# Patient Record
Sex: Male | Born: 1945 | Race: Black or African American | Hispanic: No | Marital: Married | Smoking: Former smoker
Health system: Southern US, Community
[De-identification: ages and names within clinical notes are randomized; demographics above are authoritative.]

## PROBLEM LIST (undated history)

## (undated) DIAGNOSIS — Z973 Presence of spectacles and contact lenses: Secondary | ICD-10-CM

## (undated) DIAGNOSIS — N529 Male erectile dysfunction, unspecified: Secondary | ICD-10-CM

## (undated) HISTORY — PX: HEMORRHOID SURGERY: SHX153

---

## 1988-01-09 HISTORY — PX: INGUINAL HERNIA REPAIR: SHX194

## 2000-06-11 ENCOUNTER — Encounter: Admission: RE | Admit: 2000-06-11 | Discharge: 2000-06-11 | Payer: Self-pay | Admitting: Internal Medicine

## 2000-06-11 ENCOUNTER — Encounter: Payer: Self-pay | Admitting: Internal Medicine

## 2007-07-16 ENCOUNTER — Ambulatory Visit (HOSPITAL_COMMUNITY): Admission: RE | Admit: 2007-07-16 | Discharge: 2007-07-16 | Payer: Self-pay | Admitting: Gastroenterology

## 2010-05-23 NOTE — Op Note (Signed)
NAME:  John Walters, John Walters NO.:  [id number]   MEDICAL RECORD NO.:  [id number]          PATIENT TYPE:  AMB   LOCATION:  ENDO                         FACILITY:  [facility]   PHYSICIAN:  Danise Edge, M.D.   DATE OF BIRTH:  1945-09-13   DATE OF PROCEDURE:  07/16/2007  DATE OF DISCHARGE:                               OPERATIVE REPORT   REFERRING PHYSICIAN:  Candyce Churn, MD.   PROCEDURE INDICATION:  [name] is a 65 year old male, born  12-01-1945.  [name] is scheduled to undergo his first screening  colonoscopy with polypectomy to prevent colon cancer.  He has been  treated for diverticulitis in the past.   ENDOSCOPIST:  Danise Edge, MD.   PREMEDICATION:  Fentanyl 50 mcg and Versed 7 mg.   PROCEDURE:  After obtaining informed consent, [name] was placed in  the left lateral decubitus position.  I administered intravenous  fentanyl and intravenous Versed to achieve conscious sedation for the  procedure.  The patient's blood pressure, oxygen saturation, and cardiac  rhythm were monitored throughout the procedure and documented in the  medical record.   Anal inspection and digital rectal exam were normal.  The prostate was  enlarged, but non-nodular.  The Pentax pediatric colonoscope was  introduced into the rectum and advanced to the cecum as identified by a  normal-appearing ileocecal valve and appendiceal orifice.  Colonic  preparation for the exam today was good.   Rectum:  Normal.  There was not enough room to perform a retroflexed  view of the distal rectum.  Sigmoid colon and descending colon:  Left colonic diverticulosis without  diverticulitis.  Splenic flexure:  Normal.  Transverse colon:  Normal.  Hepatic flexure:  Normal.  Ascending colon:  Normal.  Cecum and ileocecal valve:  Normal.   ASSESSMENT:  Normal screening proctocolonoscopy to the cecum.  No  endoscopic evidence for the presence of colorectal neoplasia.  Left  colonic diverticulosis noted without signs of diverticulitis or  diverticular stricture formation.   RECOMMENDATIONS:  Repeat colonoscopy in 10 years.           ______________________________  Danise Edge, M.D.     MJ/MEDQ  D:  07/16/2007  T:  07/16/2007  Job:  [id number]   cc:   Candyce Churn, M.D.

## 2016-01-13 DIAGNOSIS — Z1389 Encounter for screening for other disorder: Secondary | ICD-10-CM | POA: Diagnosis not present

## 2016-01-13 DIAGNOSIS — R030 Elevated blood-pressure reading, without diagnosis of hypertension: Secondary | ICD-10-CM | POA: Diagnosis not present

## 2016-01-13 DIAGNOSIS — K5730 Diverticulosis of large intestine without perforation or abscess without bleeding: Secondary | ICD-10-CM | POA: Diagnosis not present

## 2016-01-13 DIAGNOSIS — K219 Gastro-esophageal reflux disease without esophagitis: Secondary | ICD-10-CM | POA: Diagnosis not present

## 2016-01-13 DIAGNOSIS — Z79899 Other long term (current) drug therapy: Secondary | ICD-10-CM | POA: Diagnosis not present

## 2016-01-13 DIAGNOSIS — J309 Allergic rhinitis, unspecified: Secondary | ICD-10-CM | POA: Diagnosis not present

## 2016-01-13 DIAGNOSIS — E559 Vitamin D deficiency, unspecified: Secondary | ICD-10-CM | POA: Diagnosis not present

## 2016-01-13 DIAGNOSIS — B353 Tinea pedis: Secondary | ICD-10-CM | POA: Diagnosis not present

## 2016-01-13 DIAGNOSIS — Z0001 Encounter for general adult medical examination with abnormal findings: Secondary | ICD-10-CM | POA: Diagnosis not present

## 2016-08-29 DIAGNOSIS — K648 Other hemorrhoids: Secondary | ICD-10-CM | POA: Diagnosis not present

## 2016-08-29 DIAGNOSIS — M545 Low back pain: Secondary | ICD-10-CM | POA: Diagnosis not present

## 2016-08-29 DIAGNOSIS — K219 Gastro-esophageal reflux disease without esophagitis: Secondary | ICD-10-CM | POA: Diagnosis not present

## 2016-08-29 DIAGNOSIS — G8929 Other chronic pain: Secondary | ICD-10-CM | POA: Diagnosis not present

## 2016-08-29 MED FILL — traMADol HCL 50 MG TABS: 50 | 10 days supply | Qty: 40 | Fill #0

## 2016-08-29 MED FILL — CYCLOBENZAPRINE 5 MG TABLET: 5 | 10 days supply | Qty: 30 | Fill #0

## 2016-08-29 MED FILL — ANUCORT-HC 25 MG SUPP: 25 | 3 days supply | Qty: 6 | Fill #0

## 2016-08-29 MED FILL — OMEPRAZOLE 20 MG CAP: 20 | 30 days supply | Qty: 30 | Fill #0

## 2016-08-29 MED FILL — IBUPROFEN 800 MG TAB: 800 | 10 days supply | Qty: 30 | Fill #0

## 2016-10-10 DIAGNOSIS — Z23 Encounter for immunization: Secondary | ICD-10-CM | POA: Diagnosis not present

## 2017-01-22 DIAGNOSIS — Z79899 Other long term (current) drug therapy: Secondary | ICD-10-CM | POA: Diagnosis not present

## 2017-01-22 DIAGNOSIS — B353 Tinea pedis: Secondary | ICD-10-CM | POA: Diagnosis not present

## 2017-01-22 DIAGNOSIS — E669 Obesity, unspecified: Secondary | ICD-10-CM | POA: Diagnosis not present

## 2017-01-22 DIAGNOSIS — K5730 Diverticulosis of large intestine without perforation or abscess without bleeding: Secondary | ICD-10-CM | POA: Diagnosis not present

## 2017-01-22 DIAGNOSIS — L309 Dermatitis, unspecified: Secondary | ICD-10-CM | POA: Diagnosis not present

## 2017-01-22 DIAGNOSIS — R05 Cough: Secondary | ICD-10-CM | POA: Diagnosis not present

## 2017-01-22 DIAGNOSIS — K219 Gastro-esophageal reflux disease without esophagitis: Secondary | ICD-10-CM | POA: Diagnosis not present

## 2017-01-22 DIAGNOSIS — J309 Allergic rhinitis, unspecified: Secondary | ICD-10-CM | POA: Diagnosis not present

## 2017-01-22 DIAGNOSIS — E559 Vitamin D deficiency, unspecified: Secondary | ICD-10-CM | POA: Diagnosis not present

## 2017-01-22 DIAGNOSIS — Z0000 Encounter for general adult medical examination without abnormal findings: Secondary | ICD-10-CM | POA: Diagnosis not present

## 2017-01-22 MED FILL — TRIAMCINOLONE 0.1% CREAM: 0.1 | 15 days supply | Qty: 45 | Fill #0

## 2017-01-22 MED FILL — predniSONE 5 MG TABS: 5 | 7 days supply | Qty: 28 | Fill #0

## 2017-04-01 DIAGNOSIS — Z1211 Encounter for screening for malignant neoplasm of colon: Secondary | ICD-10-CM | POA: Diagnosis not present

## 2017-04-01 DIAGNOSIS — K219 Gastro-esophageal reflux disease without esophagitis: Secondary | ICD-10-CM | POA: Diagnosis not present

## 2017-07-18 MED FILL — PEG-3350 SOLUTION: 420 | 1 days supply | Qty: 4000 | Fill #0

## 2017-07-25 DIAGNOSIS — K635 Polyp of colon: Secondary | ICD-10-CM | POA: Diagnosis not present

## 2017-07-25 DIAGNOSIS — K6289 Other specified diseases of anus and rectum: Secondary | ICD-10-CM | POA: Diagnosis not present

## 2017-07-25 DIAGNOSIS — Z1211 Encounter for screening for malignant neoplasm of colon: Secondary | ICD-10-CM | POA: Diagnosis not present

## 2017-07-25 DIAGNOSIS — K5730 Diverticulosis of large intestine without perforation or abscess without bleeding: Secondary | ICD-10-CM | POA: Diagnosis not present

## 2017-07-25 DIAGNOSIS — D126 Benign neoplasm of colon, unspecified: Secondary | ICD-10-CM | POA: Diagnosis not present

## 2017-10-16 DIAGNOSIS — Z23 Encounter for immunization: Secondary | ICD-10-CM | POA: Diagnosis not present

## 2018-01-30 ENCOUNTER — Ambulatory Visit
Admission: RE | Admit: 2018-01-30 | Discharge: 2018-01-30 | Disposition: A | Discharge status: Home / Self Care | Payer: 59 | Source: Ambulatory Visit | Attending: Internal Medicine | Admitting: Internal Medicine

## 2018-01-30 ENCOUNTER — Other Ambulatory Visit: Payer: Self-pay | Admitting: Internal Medicine

## 2018-01-30 DIAGNOSIS — M5430 Sciatica, unspecified side: Secondary | ICD-10-CM | POA: Diagnosis not present

## 2018-01-30 DIAGNOSIS — M461 Sacroiliitis, not elsewhere classified: Secondary | ICD-10-CM | POA: Diagnosis not present

## 2018-01-30 DIAGNOSIS — Z79899 Other long term (current) drug therapy: Secondary | ICD-10-CM | POA: Diagnosis not present

## 2018-01-30 DIAGNOSIS — Z13220 Encounter for screening for lipoid disorders: Secondary | ICD-10-CM | POA: Diagnosis not present

## 2018-01-30 DIAGNOSIS — E559 Vitamin D deficiency, unspecified: Secondary | ICD-10-CM | POA: Diagnosis not present

## 2018-01-30 DIAGNOSIS — M25552 Pain in left hip: Secondary | ICD-10-CM | POA: Diagnosis not present

## 2018-01-30 DIAGNOSIS — Z0000 Encounter for general adult medical examination without abnormal findings: Secondary | ICD-10-CM | POA: Diagnosis not present

## 2018-01-30 DIAGNOSIS — K648 Other hemorrhoids: Secondary | ICD-10-CM | POA: Diagnosis not present

## 2018-01-30 DIAGNOSIS — Z125 Encounter for screening for malignant neoplasm of prostate: Secondary | ICD-10-CM | POA: Diagnosis not present

## 2018-01-30 DIAGNOSIS — K219 Gastro-esophageal reflux disease without esophagitis: Secondary | ICD-10-CM | POA: Diagnosis not present

## 2018-01-30 MED FILL — PROCTOZONE-HC 2.5 % CREA: 2.5 | 30 days supply | Qty: 30 | Fill #0

## 2018-01-30 MED FILL — predniSONE 5 MG TABS: 5 | 7 days supply | Qty: 28 | Fill #0

## 2018-04-24 DIAGNOSIS — J45909 Unspecified asthma, uncomplicated: Secondary | ICD-10-CM | POA: Diagnosis not present

## 2018-04-24 DIAGNOSIS — J209 Acute bronchitis, unspecified: Secondary | ICD-10-CM | POA: Diagnosis not present

## 2018-04-24 MED FILL — AZITHROMYCIN 250 MG TABLET: 250 | 5 days supply | Qty: 6 | Fill #0

## 2018-07-18 DIAGNOSIS — Z20828 Contact with and (suspected) exposure to other viral communicable diseases: Secondary | ICD-10-CM | POA: Diagnosis not present

## 2018-08-18 MED FILL — OMEPRAZOLE 20 MG CAPSULE DR: 20 | 90 days supply | Qty: 90 | Fill #0

## 2018-08-21 MED FILL — IBUPROFEN 800 MG TABS: 800 | 10 days supply | Qty: 30 | Fill #0

## 2018-08-21 MED FILL — CYCLOBENZAPRINE 10 MG TAB: 10 | 30 days supply | Qty: 30 | Fill #0

## 2018-09-10 DIAGNOSIS — Z20828 Contact with and (suspected) exposure to other viral communicable diseases: Secondary | ICD-10-CM | POA: Diagnosis not present

## 2018-10-29 DIAGNOSIS — Z23 Encounter for immunization: Secondary | ICD-10-CM | POA: Diagnosis not present

## 2018-11-13 DIAGNOSIS — H52223 Regular astigmatism, bilateral: Secondary | ICD-10-CM | POA: Diagnosis not present

## 2018-12-15 DIAGNOSIS — Z20828 Contact with and (suspected) exposure to other viral communicable diseases: Secondary | ICD-10-CM | POA: Diagnosis not present

## 2019-02-18 DIAGNOSIS — K219 Gastro-esophageal reflux disease without esophagitis: Secondary | ICD-10-CM | POA: Diagnosis not present

## 2019-02-18 DIAGNOSIS — M5430 Sciatica, unspecified side: Secondary | ICD-10-CM | POA: Diagnosis not present

## 2019-02-18 DIAGNOSIS — M545 Low back pain: Secondary | ICD-10-CM | POA: Diagnosis not present

## 2019-02-18 DIAGNOSIS — I10 Essential (primary) hypertension: Secondary | ICD-10-CM | POA: Diagnosis not present

## 2019-02-18 DIAGNOSIS — Z79899 Other long term (current) drug therapy: Secondary | ICD-10-CM | POA: Diagnosis not present

## 2019-02-18 DIAGNOSIS — E559 Vitamin D deficiency, unspecified: Secondary | ICD-10-CM | POA: Diagnosis not present

## 2019-02-18 DIAGNOSIS — Z125 Encounter for screening for malignant neoplasm of prostate: Secondary | ICD-10-CM | POA: Diagnosis not present

## 2019-02-18 DIAGNOSIS — K648 Other hemorrhoids: Secondary | ICD-10-CM | POA: Diagnosis not present

## 2019-02-18 DIAGNOSIS — Z0000 Encounter for general adult medical examination without abnormal findings: Secondary | ICD-10-CM | POA: Diagnosis not present

## 2019-02-18 MED FILL — TRIAMCINOLONE 0.1% CREAM: 0.1 | 25 days supply | Qty: 50 | Fill #0

## 2019-02-18 MED FILL — PROCTOZONE-HC 2.5 % CREA: 2.5 | 30 days supply | Qty: 30 | Fill #0

## 2019-02-18 MED FILL — HYDROCORTISONE ACETATE 25 M: 25 | 5 days supply | Qty: 10 | Fill #0

## 2019-02-18 MED FILL — CYCLOBENZAPRINE HCL 10 MG T: 10 | 90 days supply | Qty: 90 | Fill #0

## 2019-04-01 DIAGNOSIS — R030 Elevated blood-pressure reading, without diagnosis of hypertension: Secondary | ICD-10-CM | POA: Diagnosis not present

## 2019-07-17 DIAGNOSIS — H52223 Regular astigmatism, bilateral: Secondary | ICD-10-CM | POA: Diagnosis not present

## 2019-08-21 ENCOUNTER — Other Ambulatory Visit: Payer: Self-pay | Admitting: Internal Medicine

## 2019-08-21 ENCOUNTER — Ambulatory Visit
Admission: RE | Admit: 2019-08-21 | Discharge: 2019-08-21 | Disposition: A | Discharge status: Home / Self Care | Payer: 59 | Source: Ambulatory Visit | Attending: Internal Medicine | Admitting: Internal Medicine

## 2019-08-21 DIAGNOSIS — M5432 Sciatica, left side: Secondary | ICD-10-CM

## 2019-08-21 DIAGNOSIS — M47816 Spondylosis without myelopathy or radiculopathy, lumbar region: Secondary | ICD-10-CM | POA: Diagnosis not present

## 2019-08-21 MED FILL — traMADol HCL 50 MG TABS: 50 | 10 days supply | Qty: 50 | Fill #0

## 2019-08-21 MED FILL — predniSONE 10 MG TABS: 10 | 10 days supply | Qty: 45 | Fill #0

## 2019-09-03 ENCOUNTER — Other Ambulatory Visit (HOSPITAL_COMMUNITY): Payer: Self-pay | Admitting: Internal Medicine

## 2019-09-03 DIAGNOSIS — M5432 Sciatica, left side: Secondary | ICD-10-CM | POA: Diagnosis not present

## 2019-09-03 MED FILL — SULINDAC 150 MG TAB: 150 | 90 days supply | Qty: 90 | Fill #0

## 2019-10-21 DIAGNOSIS — Z23 Encounter for immunization: Secondary | ICD-10-CM | POA: Diagnosis not present

## 2020-03-07 MED FILL — SULINDAC 150 MG TAB: 150 | 90 days supply | Qty: 90 | Fill #1

## 2020-03-15 ENCOUNTER — Other Ambulatory Visit (HOSPITAL_COMMUNITY): Payer: Self-pay | Admitting: Internal Medicine

## 2020-03-15 DIAGNOSIS — M5432 Sciatica, left side: Secondary | ICD-10-CM | POA: Diagnosis not present

## 2020-03-15 DIAGNOSIS — M545 Low back pain, unspecified: Secondary | ICD-10-CM | POA: Diagnosis not present

## 2020-03-15 DIAGNOSIS — M5430 Sciatica, unspecified side: Secondary | ICD-10-CM | POA: Diagnosis not present

## 2020-03-15 DIAGNOSIS — I10 Essential (primary) hypertension: Secondary | ICD-10-CM | POA: Diagnosis not present

## 2020-03-15 DIAGNOSIS — K648 Other hemorrhoids: Secondary | ICD-10-CM | POA: Diagnosis not present

## 2020-03-15 DIAGNOSIS — Z125 Encounter for screening for malignant neoplasm of prostate: Secondary | ICD-10-CM | POA: Diagnosis not present

## 2020-03-15 DIAGNOSIS — E559 Vitamin D deficiency, unspecified: Secondary | ICD-10-CM | POA: Diagnosis not present

## 2020-03-15 DIAGNOSIS — Z79899 Other long term (current) drug therapy: Secondary | ICD-10-CM | POA: Diagnosis not present

## 2020-03-15 DIAGNOSIS — Z0000 Encounter for general adult medical examination without abnormal findings: Secondary | ICD-10-CM | POA: Diagnosis not present

## 2020-03-15 DIAGNOSIS — K219 Gastro-esophageal reflux disease without esophagitis: Secondary | ICD-10-CM | POA: Diagnosis not present

## 2020-03-15 DIAGNOSIS — J45909 Unspecified asthma, uncomplicated: Secondary | ICD-10-CM | POA: Diagnosis not present

## 2020-03-15 DIAGNOSIS — Z13220 Encounter for screening for lipoid disorders: Secondary | ICD-10-CM | POA: Diagnosis not present

## 2020-03-15 MED FILL — PANTOPRAZOLE SOD DR 40 MG T: 40 | 90 days supply | Qty: 45 | Fill #0

## 2020-03-15 MED FILL — CYCLOBENZAPRINE HCL 10 MG T: 10 | 90 days supply | Qty: 90 | Fill #0

## 2020-08-09 ENCOUNTER — Other Ambulatory Visit (HOSPITAL_COMMUNITY): Payer: Self-pay

## 2020-08-09 DIAGNOSIS — M5432 Sciatica, left side: Secondary | ICD-10-CM | POA: Diagnosis not present

## 2020-08-10 ENCOUNTER — Other Ambulatory Visit (HOSPITAL_COMMUNITY): Payer: Self-pay

## 2020-08-10 MED ORDER — PREDNISONE 10 MG PO TABS
50.0000 mg | ORAL_TABLET | Freq: Every day | ORAL | 0 refills | Status: DC
  Filled 2020-08-10: qty 45, 15d supply, fill #0

## 2020-08-11 ENCOUNTER — Other Ambulatory Visit (HOSPITAL_COMMUNITY): Payer: Self-pay

## 2020-09-19 DIAGNOSIS — H524 Presbyopia: Secondary | ICD-10-CM | POA: Diagnosis not present

## 2021-03-21 ENCOUNTER — Other Ambulatory Visit (HOSPITAL_COMMUNITY): Payer: Self-pay

## 2021-03-21 DIAGNOSIS — Z13220 Encounter for screening for lipoid disorders: Secondary | ICD-10-CM | POA: Diagnosis not present

## 2021-03-21 DIAGNOSIS — M5451 Vertebrogenic low back pain: Secondary | ICD-10-CM | POA: Diagnosis not present

## 2021-03-21 DIAGNOSIS — Z23 Encounter for immunization: Secondary | ICD-10-CM | POA: Diagnosis not present

## 2021-03-21 DIAGNOSIS — E559 Vitamin D deficiency, unspecified: Secondary | ICD-10-CM | POA: Diagnosis not present

## 2021-03-21 DIAGNOSIS — R030 Elevated blood-pressure reading, without diagnosis of hypertension: Secondary | ICD-10-CM | POA: Diagnosis not present

## 2021-03-21 DIAGNOSIS — Z79899 Other long term (current) drug therapy: Secondary | ICD-10-CM | POA: Diagnosis not present

## 2021-03-21 DIAGNOSIS — Z125 Encounter for screening for malignant neoplasm of prostate: Secondary | ICD-10-CM | POA: Diagnosis not present

## 2021-03-21 DIAGNOSIS — K219 Gastro-esophageal reflux disease without esophagitis: Secondary | ICD-10-CM | POA: Diagnosis not present

## 2021-03-21 DIAGNOSIS — I10 Essential (primary) hypertension: Secondary | ICD-10-CM | POA: Diagnosis not present

## 2021-03-21 DIAGNOSIS — M5432 Sciatica, left side: Secondary | ICD-10-CM | POA: Diagnosis not present

## 2021-03-21 DIAGNOSIS — Z0001 Encounter for general adult medical examination with abnormal findings: Secondary | ICD-10-CM | POA: Diagnosis not present

## 2021-03-21 MED ORDER — PANTOPRAZOLE SODIUM 40 MG PO TBEC
40.0000 mg | DELAYED_RELEASE_TABLET | ORAL | 3 refills | Status: AC
  Filled 2021-03-21: qty 45, 90d supply, fill #0
  Filled 2021-10-06: qty 45, 90d supply, fill #1

## 2021-03-21 MED ORDER — CYCLOBENZAPRINE HCL 10 MG PO TABS
10.0000 mg | ORAL_TABLET | Freq: Every evening | ORAL | 0 refills | Status: AC | PRN
  Filled 2021-03-21: qty 90, 90d supply, fill #0

## 2021-03-21 MED ORDER — SULINDAC 150 MG PO TABS
150.0000 mg | ORAL_TABLET | Freq: Every day | ORAL | 1 refills | Status: DC | PRN
  Filled 2021-03-21: qty 90, 90d supply, fill #0
  Filled 2021-10-06: qty 90, 90d supply, fill #1

## 2021-03-21 MED ORDER — DOXYCYCLINE HYCLATE 100 MG PO CAPS
100.0000 mg | ORAL_CAPSULE | Freq: Every day | ORAL | 0 refills | Status: DC
  Filled 2021-03-21: qty 10, 10d supply, fill #0

## 2021-03-22 ENCOUNTER — Other Ambulatory Visit (HOSPITAL_COMMUNITY): Payer: Self-pay

## 2021-04-04 DIAGNOSIS — R972 Elevated prostate specific antigen [PSA]: Secondary | ICD-10-CM | POA: Diagnosis not present

## 2021-06-13 ENCOUNTER — Other Ambulatory Visit (HOSPITAL_COMMUNITY): Payer: Self-pay

## 2021-06-13 DIAGNOSIS — R972 Elevated prostate specific antigen [PSA]: Secondary | ICD-10-CM | POA: Diagnosis not present

## 2021-06-13 MED ORDER — LEVOFLOXACIN 750 MG PO TABS
750.0000 mg | ORAL_TABLET | Freq: Every morning | ORAL | 0 refills | Status: DC
  Filled 2021-06-13: qty 1, 1d supply, fill #0

## 2021-06-14 DIAGNOSIS — R972 Elevated prostate specific antigen [PSA]: Secondary | ICD-10-CM | POA: Diagnosis not present

## 2021-06-15 ENCOUNTER — Other Ambulatory Visit: Payer: Self-pay | Admitting: Urology

## 2021-06-15 DIAGNOSIS — R972 Elevated prostate specific antigen [PSA]: Secondary | ICD-10-CM

## 2021-07-03 ENCOUNTER — Ambulatory Visit
Admission: RE | Admit: 2021-07-03 | Discharge: 2021-07-03 | Disposition: A | Discharge status: Home / Self Care | Payer: 59 | Source: Ambulatory Visit | Attending: Urology | Admitting: Urology

## 2021-07-03 DIAGNOSIS — R972 Elevated prostate specific antigen [PSA]: Secondary | ICD-10-CM

## 2021-07-03 MED ORDER — GADOBENATE DIMEGLUMINE 529 MG/ML IV SOLN
15.0000 mL | Freq: Once | INTRAVENOUS | Status: AC | PRN
  Administered 2021-07-03: 15 mL via INTRAVENOUS

## 2021-07-08 DIAGNOSIS — C61 Malignant neoplasm of prostate: Secondary | ICD-10-CM

## 2021-07-08 HISTORY — DX: Malignant neoplasm of prostate: C61

## 2021-07-14 DIAGNOSIS — C61 Malignant neoplasm of prostate: Secondary | ICD-10-CM | POA: Diagnosis not present

## 2021-07-14 DIAGNOSIS — R972 Elevated prostate specific antigen [PSA]: Secondary | ICD-10-CM | POA: Diagnosis not present

## 2021-07-14 DIAGNOSIS — D075 Carcinoma in situ of prostate: Secondary | ICD-10-CM | POA: Diagnosis not present

## 2021-07-21 ENCOUNTER — Other Ambulatory Visit (HOSPITAL_COMMUNITY): Payer: Self-pay

## 2021-07-21 DIAGNOSIS — N5201 Erectile dysfunction due to arterial insufficiency: Secondary | ICD-10-CM | POA: Diagnosis not present

## 2021-07-21 DIAGNOSIS — C61 Malignant neoplasm of prostate: Secondary | ICD-10-CM | POA: Diagnosis not present

## 2021-07-21 MED ORDER — SILDENAFIL CITRATE 100 MG PO TABS
50.0000 mg | ORAL_TABLET | Freq: Every day | ORAL | 3 refills | Status: AC | PRN
  Filled 2021-07-21: qty 10, 20d supply, fill #0

## 2021-07-25 ENCOUNTER — Other Ambulatory Visit (HOSPITAL_COMMUNITY): Payer: Self-pay | Admitting: Urology

## 2021-07-25 DIAGNOSIS — C61 Malignant neoplasm of prostate: Secondary | ICD-10-CM

## 2021-08-01 ENCOUNTER — Encounter (HOSPITAL_COMMUNITY)
Admission: RE | Admit: 2021-08-01 | Discharge: 2021-08-01 | Disposition: A | Discharge status: Home / Self Care | Payer: 59 | Source: Ambulatory Visit | Attending: Urology | Admitting: Urology

## 2021-08-01 DIAGNOSIS — C61 Malignant neoplasm of prostate: Secondary | ICD-10-CM | POA: Diagnosis not present

## 2021-08-01 MED ORDER — PIFLIFOLASTAT F 18 (PYLARIFY) INJECTION
9.0000 | Freq: Once | INTRAVENOUS | Status: AC
  Administered 2021-08-01: 9.1 via INTRAVENOUS

## 2021-08-01 NOTE — Progress Notes (Signed)
GU Location of Tumor / Histology: Prostate Ca  If Prostate Cancer, Gleason Score is (4 + 3) and PSA is (5.97 as of 06/2021)  Biopsies      Past/Anticipated interventions by urology, if any:     Past/Anticipated interventions by medical oncology, if any: NA  Weight changes, if any:  No  IPPS:  1 SHIM:  25  Bowel/Bladder complaints, if any:  No  Nausea/Vomiting, if any: No  Pain issues, if any:  0/10  SAFETY ISSUES: Prior radiation? No Pacemaker/ICD? No Possible current pregnancy? Male Is the patient on methotrexate? No  Current Complaints / other details:

## 2021-08-04 DIAGNOSIS — C61 Malignant neoplasm of prostate: Secondary | ICD-10-CM | POA: Insufficient documentation

## 2021-08-04 NOTE — Progress Notes (Signed)
Radiation Oncology         [phone] ________________________________  Initial Outpatient Consultation  Name: John Walters MRN: [id number]  Date: 08/07/2021  DOB: 09/07/1945  HB:ZJIRC, John [place], MD  Ardis Hughs, MD   REFERRING PHYSICIAN: Ardis Hughs, MD  DIAGNOSIS: 76 y.o. gentleman with Stage T1c adenocarcinoma of the prostate with Gleason score of 4+3, and PSA of 5.97.    ICD-10-CM   1. Malignant neoplasm of prostate ([place])  C61       HISTORY OF PRESENT ILLNESS: John Walters is a 76 y.o. male with a diagnosis of prostate cancer. He has a history of rising PSA since 2019 and was noted to have an elevated PSA of 4.56 on routine labs in 03/2021 by his primary care physician, Dr. Inda Merlin.   The PSA was repeated on 06/04/21 and noted to be further elevated at 5.44.  Accordingly, he was referred for evaluation in urology by Dr. Louis Meckel on 06/13/2021,  digital rectal examination was performed at that time revealing no nodularity or concerning findings.  He had another PSA drawn that same day and this remained elevated at 5.97 so the patient proceeded with a prostate MRI performed on 07/03/2021.  The MRI confirmed a small PI-RADS 4 lesion in the right posterior medial and posterior lateral peripheral zone at the apex.  An MRI fusion transrectal ultrasound with 15 biopsies of the prostate was performed on 07/14/2021.  The prostate volume measured 29 cc.  Out of 15 core biopsies, 10 were positive, including all cores on the right.  The maximum Gleason score was 4+3, and this was seen in all 3/3 samples from the MRI ROI as well as the right mid.  The remaining 5 cores in the right hemiprostate contained Gleason 3+4 and a focus of Gleason 3+4 was also noted in the left apex.  A PSMA PET scan was performed on 08/01/2021 for disease staging and confirms localized prostate cancer without any evidence of metastatic disease.  The patient reviewed the biopsy results with his urologist and he has  kindly been referred today for discussion of potential radiation treatment options.   PREVIOUS RADIATION THERAPY: No  PAST MEDICAL HISTORY: No past medical history on file.    PAST SURGICAL HISTORY:*** The histories are not reviewed yet. Please review them in the "History" navigator section and refresh this [place].  FAMILY HISTORY: No family history on file.  SOCIAL HISTORY:  Social History   Socioeconomic History   Marital status: Married    Spouse name: Not on file   Number of children: Not on file   Years of education: Not on file   Highest education level: Not on file  Occupational History   Not on file  Tobacco Use   Smoking status: Not on file   Smokeless tobacco: Not on file  Substance and Sexual Activity   Alcohol use: Not on file   Drug use: Not on file   Sexual activity: Not on file  Other Topics Concern   Not on file  Social History Narrative   Not on file   Social Determinants of Health   Financial Resource Strain: Not on file  Food Insecurity: Not on file  Transportation Needs: Not on file  Physical Activity: Not on file  Stress: Not on file  Social Connections: Not on file  Intimate Partner Violence: Not on file    ALLERGIES: Codeine  MEDICATIONS:  Current Outpatient Medications  Medication Sig Dispense Refill   cyclobenzaprine (FLEXERIL) 10 MG  tablet Take 1 tablet (10 mg total) by mouth at bedtime as needed. 90 tablet 0   doxycycline (VIBRAMYCIN) 100 MG capsule Take 1 capsule (100 mg total) by mouth daily. 10 capsule 0   levofloxacin (LEVAQUIN) 750 MG tablet Take 1 tablet (750 mg total) by mouth once on the morning of the procedure. 1 tablet 0   pantoprazole (PROTONIX) 40 MG tablet TAKE 1 TABLET BY MOUTH EVERY OTHER DAY. 45 tablet 3   pantoprazole (PROTONIX) 40 MG tablet Take 1 tablet (40 mg total) by mouth every other day. 45 tablet 3   predniSONE (DELTASONE) 10 MG tablet Take 5 tablets (50 mg total) by mouth daily for 3 days then decrease by  1 tablet every 3 days until gone for 15 days. 45 tablet 0   sildenafil (VIAGRA) 100 MG tablet Take 0.5 tablets (50 mg total) by mouth daily as needed. 10 tablet 3   sulindac (CLINORIL) 150 MG tablet Take 1 tablet (150 mg total) by mouth daily with food as needed. 90 tablet 1   No current facility-administered medications for this visit.      REVIEW OF SYSTEMS:  On review of systems, the patient reports that he is doing well overall. He denies any chest pain, shortness of breath, cough, fevers, chills, night sweats, unintended weight changes. He denies any bowel disturbances, and denies abdominal pain, nausea or vomiting. He denies any new musculoskeletal or joint aches or pains. His IPSS was  , indicating *** urinary symptoms (Reference 0-7 mild, 8-19 moderate, 20-35 severe).  His SHIM was  , indicating he {does not have/has mild/moderate/severe} erectile dysfunction (Reference - 22-25 None, 17-21 Mild, 8-16 Moderate, 1-7 Severe). A complete review of systems is obtained and is otherwise negative.    PHYSICAL EXAM:  Wt Readings from Last 3 Encounters:  08/01/21 179 lb (81.2 kg)   Temp Readings from Last 3 Encounters:  No data found for Temp   BP Readings from Last 3 Encounters:  No data found for BP   Pulse Readings from Last 3 Encounters:  No data found for Pulse    /10  In general this is a well appearing African-American male in no acute distress. He's alert and oriented x4 and appropriate throughout the examination. Cardiopulmonary assessment is negative for acute distress, and he exhibits normal effort.     KPS = ***  100 - Normal; no complaints; no evidence of disease. 90   - Able to carry on normal activity; minor signs or symptoms of disease. 80   - Normal activity with effort; some signs or symptoms of disease. 15   - Cares for self; unable to carry on normal activity or to do active work. 60   - Requires occasional assistance, but is able to care for most of his personal  needs. 50   - Requires considerable assistance and frequent medical care. 86   - Disabled; requires special care and assistance. 81   - Severely disabled; hospital admission is indicated although death not imminent. 40   - Very sick; hospital admission necessary; active supportive treatment necessary. 10   - Moribund; fatal processes progressing rapidly. 0     - Dead  Karnofsky DA, Abelmann WH, Craver LS and Burchenal JH [phone]) The use of the nitrogen mustards in the palliative treatment of carcinoma: with particular reference to bronchogenic carcinoma Cancer 1 634-56  LABORATORY DATA:  No results found for: "WBC", "HGB", "HCT", "MCV", "PLT" No results found for: "NA", "K", "CL", "CO2"  No results found for: "ALT", "AST", "GGT", "ALKPHOS", "BILITOT"   RADIOGRAPHY: NM PET (PSMA) SKULL TO MID THIGH  Result Date: 08/01/2021 CLINICAL DATA:  New diagnosis prostate carcinoma.  PSA equal 5.97 EXAM: NUCLEAR MEDICINE PET SKULL BASE TO THIGH TECHNIQUE: 9.1 mCi F18 Piflufolastat (Pylarify) was injected intravenously. Full-ring PET imaging was performed from the skull base to thigh after the radiotracer. CT data was obtained and used for attenuation correction and anatomic localization. COMPARISON:  Prostate MRI 07/03/2021 FINDINGS: NECK No radiotracer activity in neck lymph nodes. Incidental CT finding: None CHEST No radiotracer accumulation within mediastinal or hilar lymph nodes. No suspicious pulmonary nodules on the CT scan. Incidental CT finding: None ABDOMEN/PELVIS Prostate: Focal radiotracer activity in the posterior medial RIGHT lobe of the prostate gland with SUV max equal 5.9 (image 205). Second focus anteriorly in the RIGHT mid gland on image 203. Lymph nodes: No abnormal radiotracer accumulation within pelvic or abdominal nodes. Liver: No evidence of liver metastasis Incidental CT finding: Isodense lesion exophytic from the midpole LEFT kidney measures 16 mm (image 136/4). No PMSA radiotracer  activity. SKELETON No focal  activity to suggest skeletal metastasis. IMPRESSION: 1. No evidence local prostate cancer recurrence in the prostatectomy bed. 2. No evidence metastatic adenopathy in the pelvis or periaortic retroperitoneum. 3. No evidence of visceral metastasis or skeletal metastasis. 4. Isodense lesion exophytic from the midpole LEFT kidney is indeterminate. Recommend MRI without and WITH contrast to exclude a solid renal mass. Electronically Signed   By: Suzy Bouchard M.D.   On: 08/01/2021 20:34      IMPRESSION/PLAN: 1. 76 y.o. gentleman with Stage T1c adenocarcinoma of the prostate with Gleason Score of 4+3, and PSA of 5.97. We discussed the patient's workup and outlined the nature of prostate cancer in this setting. The patient's T stage, Gleason's score, and PSA put him into the unfavorable intermediate risk group. Accordingly, he is eligible for a variety of potential treatment options including prostatectomy, brachytherapy or ST-ADT in combination with 5.5 weeks of external radiation. We discussed the available radiation techniques, and focused on the details and logistics of delivery. We discussed and outlined the risks, benefits, short and long-term effects associated with radiotherapy and compared and contrasted these with prostatectomy. We discussed the role of SpaceOAR gel in reducing the rectal toxicity associated with radiotherapy. We also detailed the role of ADT in the treatment of unfavorable intermediate risk prostate cancer and outlined the associated side effects that could be expected with this therapy.  He appears to have a good understanding of his disease and our treatment recommendations which are of curative intent.  He was encouraged to ask questions that were answered to his stated satisfaction.  At the conclusion of our conversation, the patient is interested in moving forward with ***.  I personally spent *** minutes in this encounter including chart review,  reviewing radiological studies, meeting face-to-face with the patient, entering orders and completing documentation.   ------------------------------------------------   Tyler Pita, MD [place]: [phone]  Fax: [phone] [email or web address]  Skype  LinkedIn

## 2021-08-07 ENCOUNTER — Encounter: Payer: Self-pay | Admitting: Radiation Oncology

## 2021-08-07 ENCOUNTER — Ambulatory Visit
Admission: RE | Admit: 2021-08-07 | Discharge: 2021-08-07 | Disposition: A | Discharge status: Home / Self Care | Payer: 59 | Source: Ambulatory Visit | Attending: Radiation Oncology | Admitting: Radiation Oncology

## 2021-08-07 DIAGNOSIS — J329 Chronic sinusitis, unspecified: Secondary | ICD-10-CM | POA: Insufficient documentation

## 2021-08-07 DIAGNOSIS — B353 Tinea pedis: Secondary | ICD-10-CM | POA: Insufficient documentation

## 2021-08-07 DIAGNOSIS — C61 Malignant neoplasm of prostate: Secondary | ICD-10-CM | POA: Diagnosis not present

## 2021-08-07 DIAGNOSIS — G8929 Other chronic pain: Secondary | ICD-10-CM | POA: Insufficient documentation

## 2021-08-07 DIAGNOSIS — Z191 Hormone sensitive malignancy status: Secondary | ICD-10-CM | POA: Diagnosis not present

## 2021-08-07 DIAGNOSIS — K219 Gastro-esophageal reflux disease without esophagitis: Secondary | ICD-10-CM | POA: Insufficient documentation

## 2021-08-07 DIAGNOSIS — L309 Dermatitis, unspecified: Secondary | ICD-10-CM | POA: Insufficient documentation

## 2021-08-07 DIAGNOSIS — Z79899 Other long term (current) drug therapy: Secondary | ICD-10-CM | POA: Insufficient documentation

## 2021-08-07 DIAGNOSIS — E669 Obesity, unspecified: Secondary | ICD-10-CM | POA: Insufficient documentation

## 2021-08-07 DIAGNOSIS — Z125 Encounter for screening for malignant neoplasm of prostate: Secondary | ICD-10-CM | POA: Insufficient documentation

## 2021-08-07 DIAGNOSIS — R030 Elevated blood-pressure reading, without diagnosis of hypertension: Secondary | ICD-10-CM | POA: Insufficient documentation

## 2021-08-07 DIAGNOSIS — M25519 Pain in unspecified shoulder: Secondary | ICD-10-CM | POA: Insufficient documentation

## 2021-08-07 DIAGNOSIS — K5730 Diverticulosis of large intestine without perforation or abscess without bleeding: Secondary | ICD-10-CM | POA: Insufficient documentation

## 2021-08-07 DIAGNOSIS — R059 Cough, unspecified: Secondary | ICD-10-CM | POA: Insufficient documentation

## 2021-08-07 DIAGNOSIS — E559 Vitamin D deficiency, unspecified: Secondary | ICD-10-CM | POA: Insufficient documentation

## 2021-08-07 DIAGNOSIS — K5732 Diverticulitis of large intestine without perforation or abscess without bleeding: Secondary | ICD-10-CM | POA: Insufficient documentation

## 2021-08-07 DIAGNOSIS — M461 Sacroiliitis, not elsewhere classified: Secondary | ICD-10-CM | POA: Insufficient documentation

## 2021-08-07 DIAGNOSIS — M545 Low back pain, unspecified: Secondary | ICD-10-CM | POA: Insufficient documentation

## 2021-08-07 DIAGNOSIS — M5430 Sciatica, unspecified side: Secondary | ICD-10-CM | POA: Insufficient documentation

## 2021-08-07 DIAGNOSIS — K625 Hemorrhage of anus and rectum: Secondary | ICD-10-CM | POA: Insufficient documentation

## 2021-08-07 DIAGNOSIS — I10 Essential (primary) hypertension: Secondary | ICD-10-CM | POA: Insufficient documentation

## 2021-08-07 DIAGNOSIS — J40 Bronchitis, not specified as acute or chronic: Secondary | ICD-10-CM | POA: Insufficient documentation

## 2021-08-07 DIAGNOSIS — L820 Inflamed seborrheic keratosis: Secondary | ICD-10-CM | POA: Insufficient documentation

## 2021-08-07 DIAGNOSIS — J0410 Acute tracheitis without obstruction: Secondary | ICD-10-CM | POA: Insufficient documentation

## 2021-08-07 DIAGNOSIS — R197 Diarrhea, unspecified: Secondary | ICD-10-CM | POA: Insufficient documentation

## 2021-08-07 DIAGNOSIS — K629 Disease of anus and rectum, unspecified: Secondary | ICD-10-CM | POA: Insufficient documentation

## 2021-08-07 DIAGNOSIS — E7800 Pure hypercholesterolemia, unspecified: Secondary | ICD-10-CM | POA: Insufficient documentation

## 2021-08-07 DIAGNOSIS — R519 Headache, unspecified: Secondary | ICD-10-CM | POA: Insufficient documentation

## 2021-08-07 DIAGNOSIS — K649 Unspecified hemorrhoids: Secondary | ICD-10-CM | POA: Insufficient documentation

## 2021-08-07 NOTE — Progress Notes (Signed)
Introduced myself to the patient as the prostate nurse navigator.  No barriers to care identified at this time.  He is here to discuss his radiation treatment options.  I gave him my business card and asked him to call me with questions or concerns.  Verbalized understanding.  ?

## 2021-08-10 DIAGNOSIS — K5730 Diverticulosis of large intestine without perforation or abscess without bleeding: Secondary | ICD-10-CM | POA: Diagnosis not present

## 2021-08-10 DIAGNOSIS — C649 Malignant neoplasm of unspecified kidney, except renal pelvis: Secondary | ICD-10-CM | POA: Diagnosis not present

## 2021-08-10 DIAGNOSIS — C642 Malignant neoplasm of left kidney, except renal pelvis: Secondary | ICD-10-CM | POA: Diagnosis not present

## 2021-08-10 DIAGNOSIS — C61 Malignant neoplasm of prostate: Secondary | ICD-10-CM | POA: Diagnosis not present

## 2021-08-11 ENCOUNTER — Other Ambulatory Visit: Payer: Self-pay | Admitting: Urology

## 2021-08-14 ENCOUNTER — Telehealth: Payer: Self-pay | Admitting: *Deleted

## 2021-08-14 NOTE — Telephone Encounter (Signed)
XXXX 

## 2021-08-14 NOTE — Telephone Encounter (Signed)
RETURNED PATIENT'S WIFE'S PHONE CALL, SPOKE WITH PATIENT'S WIFE- John Walters

## 2021-08-28 ENCOUNTER — Encounter (HOSPITAL_BASED_OUTPATIENT_CLINIC_OR_DEPARTMENT_OTHER): Payer: Self-pay | Admitting: Urology

## 2021-08-28 NOTE — Progress Notes (Signed)
Spoke w/ via phone for pre-op interview--- pt Lab needs dos----  no             Lab results------ no COVID test -----patient states asymptomatic no test needed Arrive at ------- 0900 on 09-12-2021 NPO after MN NO Solid Food.  Clear liquids from MN until--- 0800 Med rec completed Medications to take morning of surgery ----- protonix Diabetic medication ----- n/a Patient instructed no nail polish to be worn day of surgery Patient instructed to bring photo id and insurance card day of surgery Patient aware to have Driver (ride ) / caregiver for 24 hours after surgery --- wife, John Walters Patient Special Instructions ----- n/a Pre-Op special Istructions ----- n/a Patient verbalized understanding of instructions that were given at this phone interview. Patient denies shortness of breath, chest pain, fever, cough at this phone interview.

## 2021-09-08 NOTE — H&P (Signed)
76 year old male who presents today for discussion of his prostate cancer. His PSA has been followed closely by his primary care doctor and over the past 4 years has been climbing. His most recent PSA was 5. 9 7. He has no significant voiding symptoms. He also has fairly normal erectile function for his age. The patient is otherwise quite healthy and takes very few medications.   The patient underwent an MRI prior to his biopsy. This demonstrated a PI-RADS 4 lesion in the right posterior medial and right posterior lateral peripheral zone. His prostate was measured at 34 g   The patient underwent a MRI fusion guided prostate biopsy on July 14, 2021. This demonstrated 4+ 3 = 7 prostate cancer within the PI-RADS 4 lesion as well as on the right side of his prostate.   The patient recovered well from the prostate biopsy without any additional ongoing bleeding or voiding symptoms.     ALLERGIES: Codeine    MEDICATIONS: Levofloxacin 750 mg tablet 1 tablet PO once Take on morning of the procedure  Sulindac     GU PSH: Prostate Needle Biopsy - 07/14/2021     NON-GU PSH: Hemorrhoidectomy Hernia Repair Surgical Pathology, Gross And Microscopic Examination For Prostate Needle - 07/14/2021     GU PMH: Elevated PSA - 07/14/2021, - 06/13/2021      PMH Notes: Kidney stones, hepatitis.   NON-GU PMH: GERD    FAMILY HISTORY: No Family History    SOCIAL HISTORY: Marital Status: Married Preferred Language: English; Ethnicity: Not Hispanic Or Latino; Race: Black or African American Current Smoking Status: Patient does not smoke anymore.   Tobacco Use Assessment Completed: Used Tobacco in last 30 days? Does drink.  Drinks 1 caffeinated drink per day.    REVIEW OF SYSTEMS:    GU Review Male:   Patient denies frequent urination, hard to postpone urination, burning/ pain with urination, get up at night to urinate, leakage of urine, stream starts and stops, trouble starting your stream, have to strain to  urinate , erection problems, and penile pain.  Gastrointestinal (Upper):   Patient denies nausea, vomiting, and indigestion/ heartburn.  Gastrointestinal (Lower):   Patient denies diarrhea and constipation.  Constitutional:   Patient denies fever, night sweats, weight loss, and fatigue.  Skin:   Patient denies skin rash/ lesion and itching.  Eyes:   Patient denies blurred vision and double vision.  Ears/ Nose/ Throat:   Patient denies sore throat and sinus problems.  Hematologic/Lymphatic:   Patient denies swollen glands and easy bruising.  Cardiovascular:   Patient denies chest pains and leg swelling.  Respiratory:   Patient denies cough and shortness of breath.  Endocrine:   Patient denies excessive thirst.  Musculoskeletal:   Patient denies back pain and joint pain.  Neurological:   Patient denies headaches and dizziness.  Psychologic:   Patient denies depression and anxiety.   VITAL SIGNS: None   Complexity of Data:  Source Of History:  Patient  Lab Test Review:   PSA  Records Review:   Pathology Reports, Previous Doctor Records, Previous Patient Records, POC Tool  Urine Test Review:   Urinalysis   06/14/21  PSA  Total PSA 5.97 ng/mL  Free PSA 0.53 ng/mL  % Free PSA 9 % PSA    PROCEDURES:          Urinalysis - 81003 Dipstick Dipstick Cont'd  Color: Yellow Bilirubin: Neg  Appearance: Clear Ketones: Neg  Specific Gravity: 1.025 Blood: Neg  pH: <=5.0 Protein:  Neg  Glucose: Neg Urobilinogen: 0.2    Nitrites: Neg    Leukocyte Esterase: Neg    Notes:      ASSESSMENT:      ICD-10 Details  1 GU:   Prostate Cancer - C61   2   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, [place] [address]  Phone:  [phone]  Fax:  [phone]             Orders X-Rays: PET Scan - PMSA          Schedule         Document Letter(s):  Created for Patient: Clinical Summary         Notes:   The patient has unfavorable intermediate risk prostate cancer. I recommended treatment. I recommended he strongly consider radiation. We discussed the potential need for short course of androgen deprivation. We also discussed the likelihood that we would move forward with SpaceOAR prior to or concomitantly with his radiation. I will send him to Dr. Tammi Klippel for further discussion, we will set up the [facility] installation subsequently. If he needs androgen deprivation initiation we can do that in a timely fashion. He does have some plans at the end of August which we will plan to work around

## 2021-09-12 ENCOUNTER — Other Ambulatory Visit: Payer: Self-pay

## 2021-09-12 ENCOUNTER — Ambulatory Visit (HOSPITAL_BASED_OUTPATIENT_CLINIC_OR_DEPARTMENT_OTHER): Payer: 59 | Admitting: Certified Registered Nurse Anesthetist

## 2021-09-12 ENCOUNTER — Encounter (HOSPITAL_BASED_OUTPATIENT_CLINIC_OR_DEPARTMENT_OTHER): Payer: Self-pay | Admitting: Urology

## 2021-09-12 ENCOUNTER — Ambulatory Visit (HOSPITAL_BASED_OUTPATIENT_CLINIC_OR_DEPARTMENT_OTHER)
Admission: RE | Admit: 2021-09-12 | Discharge: 2021-09-12 | Disposition: A | Discharge status: Home / Self Care | Payer: 59 | Attending: Urology | Admitting: Urology

## 2021-09-12 ENCOUNTER — Encounter (HOSPITAL_BASED_OUTPATIENT_CLINIC_OR_DEPARTMENT_OTHER)
Admission: RE | Disposition: A | Discharge status: Home / Self Care | Payer: Self-pay | Source: Home / Self Care | Attending: Urology

## 2021-09-12 DIAGNOSIS — C61 Malignant neoplasm of prostate: Secondary | ICD-10-CM | POA: Insufficient documentation

## 2021-09-12 DIAGNOSIS — Z87891 Personal history of nicotine dependence: Secondary | ICD-10-CM | POA: Insufficient documentation

## 2021-09-12 DIAGNOSIS — I10 Essential (primary) hypertension: Secondary | ICD-10-CM | POA: Insufficient documentation

## 2021-09-12 DIAGNOSIS — N5201 Erectile dysfunction due to arterial insufficiency: Secondary | ICD-10-CM | POA: Diagnosis not present

## 2021-09-12 DIAGNOSIS — Z01818 Encounter for other preprocedural examination: Secondary | ICD-10-CM

## 2021-09-12 DIAGNOSIS — M1990 Unspecified osteoarthritis, unspecified site: Secondary | ICD-10-CM

## 2021-09-12 HISTORY — PX: SPACE OAR INSTILLATION: SHX6769

## 2021-09-12 HISTORY — PX: GOLD SEED IMPLANT: SHX6343

## 2021-09-12 HISTORY — DX: Male erectile dysfunction, unspecified: N52.9

## 2021-09-12 HISTORY — DX: Presence of spectacles and contact lenses: Z97.3

## 2021-09-12 SURGERY — INSERTION, GOLD SEEDS
Anesthesia: Monitor Anesthesia Care | Site: Prostate

## 2021-09-12 MED ORDER — AMISULPRIDE (ANTIEMETIC) 5 MG/2ML IV SOLN: 10.0000 mg | Freq: Once | INTRAVENOUS | Status: DC | PRN

## 2021-09-12 MED ORDER — SODIUM CHLORIDE (PF) 0.9 % IJ SOLN
INTRAMUSCULAR | Status: DC | PRN
  Administered 2021-09-12: 10 mL

## 2021-09-12 MED ORDER — FENTANYL CITRATE (PF) 100 MCG/2ML IJ SOLN: 25.0000 ug | INTRAMUSCULAR | Status: DC | PRN

## 2021-09-12 MED ORDER — CIPROFLOXACIN IN D5W 400 MG/200ML IV SOLN
INTRAVENOUS | Status: AC
  Filled 2021-09-12: qty 200

## 2021-09-12 MED ORDER — ACETAMINOPHEN 325 MG PO TABS: 325.0000 mg | ORAL_TABLET | ORAL | Status: DC | PRN

## 2021-09-12 MED ORDER — PROPOFOL 500 MG/50ML IV EMUL
INTRAVENOUS | Status: DC | PRN
  Administered 2021-09-12: 200 ug/kg/min via INTRAVENOUS

## 2021-09-12 MED ORDER — PHENYLEPHRINE 80 MCG/ML (10ML) SYRINGE FOR IV PUSH (FOR BLOOD PRESSURE SUPPORT)
PREFILLED_SYRINGE | INTRAVENOUS | Status: AC
  Filled 2021-09-12: qty 10

## 2021-09-12 MED ORDER — ACETAMINOPHEN 10 MG/ML IV SOLN
1000.0000 mg | Freq: Once | INTRAVENOUS | Status: DC | PRN
  Administered 2021-09-12: 1000 mg via INTRAVENOUS

## 2021-09-12 MED ORDER — FENTANYL CITRATE (PF) 100 MCG/2ML IJ SOLN
INTRAMUSCULAR | Status: AC
  Filled 2021-09-12: qty 2

## 2021-09-12 MED ORDER — OXYCODONE HCL 5 MG/5ML PO SOLN: 5.0000 mg | Freq: Once | ORAL | Status: DC | PRN

## 2021-09-12 MED ORDER — ONDANSETRON HCL 4 MG/2ML IJ SOLN
INTRAMUSCULAR | Status: AC
  Filled 2021-09-12: qty 4

## 2021-09-12 MED ORDER — FENTANYL CITRATE (PF) 100 MCG/2ML IJ SOLN
INTRAMUSCULAR | Status: DC | PRN
Start: 2021-09-12 — End: 2021-09-12
  Administered 2021-09-12: 50 ug via INTRAVENOUS

## 2021-09-12 MED ORDER — ACETAMINOPHEN 10 MG/ML IV SOLN
INTRAVENOUS | Status: AC
  Filled 2021-09-12: qty 100

## 2021-09-12 MED ORDER — ACETAMINOPHEN 160 MG/5ML PO SOLN: 325.0000 mg | ORAL | Status: DC | PRN

## 2021-09-12 MED ORDER — PHENYLEPHRINE 80 MCG/ML (10ML) SYRINGE FOR IV PUSH (FOR BLOOD PRESSURE SUPPORT)
PREFILLED_SYRINGE | INTRAVENOUS | Status: DC | PRN
  Administered 2021-09-12: 160 ug via INTRAVENOUS
  Administered 2021-09-12: 240 ug via INTRAVENOUS

## 2021-09-12 MED ORDER — PROPOFOL 10 MG/ML IV BOLUS
INTRAVENOUS | Status: DC | PRN
  Administered 2021-09-12 (×2): 20 mg via INTRAVENOUS

## 2021-09-12 MED ORDER — ONDANSETRON HCL 4 MG/2ML IJ SOLN
INTRAMUSCULAR | Status: DC | PRN
  Administered 2021-09-12: 4 mg via INTRAVENOUS

## 2021-09-12 MED ORDER — LACTATED RINGERS IV SOLN: INTRAVENOUS | Status: DC

## 2021-09-12 MED ORDER — LIDOCAINE HCL 1 % IJ SOLN
INTRAMUSCULAR | Status: DC | PRN
  Administered 2021-09-12: 9 mL

## 2021-09-12 MED ORDER — CIPROFLOXACIN IN D5W 400 MG/200ML IV SOLN
400.0000 mg | INTRAVENOUS | Status: AC
  Administered 2021-09-12: 400 mg via INTRAVENOUS

## 2021-09-12 MED ORDER — OXYCODONE HCL 5 MG PO TABS: 5.0000 mg | ORAL_TABLET | Freq: Once | ORAL | Status: DC | PRN

## 2021-09-12 MED ORDER — PROPOFOL 500 MG/50ML IV EMUL
INTRAVENOUS | Status: AC
  Filled 2021-09-12: qty 50

## 2021-09-12 SURGICAL SUPPLY — 26 items
BLADE CLIPPER SENSICLIP SURGIC (BLADE) ×1 IMPLANT
CNTNR URN SCR LID CUP LEK RST (MISCELLANEOUS) ×1 IMPLANT
CONT SPEC 4OZ STRL OR WHT (MISCELLANEOUS) ×1
COVER BACK TABLE 60X90IN (DRAPES) ×1 IMPLANT
DRSG TEGADERM 4X4.75 (GAUZE/BANDAGES/DRESSINGS) ×1 IMPLANT
DRSG TEGADERM 8X12 (GAUZE/BANDAGES/DRESSINGS) ×1 IMPLANT
GAUZE SPONGE 4X4 12PLY STRL (GAUZE/BANDAGES/DRESSINGS) ×1 IMPLANT
GAUZE SPONGE 4X4 12PLY STRL LF (GAUZE/BANDAGES/DRESSINGS) IMPLANT
GLOVE BIO SURGEON STRL SZ7.5 (GLOVE) ×1 IMPLANT
GLOVE ECLIPSE 8.0 STRL XLNG CF (GLOVE) ×1 IMPLANT
GLOVE SURG ORTHO 8.5 STRL (GLOVE) ×1 IMPLANT
IMPL SPACEOAR VUE SYSTEM (Spacer) ×1 IMPLANT
IMPLANT SPACEOAR VUE SYSTEM (Spacer) ×1 IMPLANT
KIT TURNOVER CYSTO (KITS) ×1 IMPLANT
MARKER GOLD PRELOAD 1.2X3 (Urological Implant) ×1 IMPLANT
MARKER SKIN DUAL TIP RULER LAB (MISCELLANEOUS) ×1 IMPLANT
NDL SPNL 22GX3.5 QUINCKE BK (NEEDLE) ×1 IMPLANT
NEEDLE SPNL 22GX3.5 QUINCKE BK (NEEDLE) ×1 IMPLANT
SEED GOLD PRELOAD 1.2X3 (Urological Implant) ×1 IMPLANT
SHEATH ULTRASOUND LF (SHEATH) IMPLANT
SHEATH ULTRASOUND LTX NONSTRL (SHEATH) IMPLANT
SURGILUBE 2OZ TUBE FLIPTOP (MISCELLANEOUS) ×1 IMPLANT
SYR 10ML LL (SYRINGE) IMPLANT
SYR CONTROL 10ML LL (SYRINGE) ×1 IMPLANT
TOWEL OR 17X26 10 PK STRL BLUE (TOWEL DISPOSABLE) ×1 IMPLANT
UNDERPAD 30X36 HEAVY ABSORB (UNDERPADS AND DIAPERS) ×1 IMPLANT

## 2021-09-12 NOTE — Interval H&P Note (Signed)
History and Physical Interval Note:  09/12/2021 9:47 AM  John Walters  has presented today for surgery, with the diagnosis of PROSTATE CANCER.  The various methods of treatment have been discussed with the patient and family. After consideration of risks, benefits and other options for treatment, the patient has consented to  Procedure(s) with comments: GOLD SEED IMPLANT (N/A) - 30 MINUTES NEEDED FOR CASE SPACE OAR INSTILLATION (N/A) as a surgical intervention.  The patient's history has been reviewed, patient examined, no change in status, stable for surgery.  I have reviewed the patient's chart and labs.  Questions were answered to the patient's satisfaction.     Remi Haggard

## 2021-09-12 NOTE — Op Note (Signed)
Preoperative diagnosis: Clinically localized adenocarcinoma of the prostate   Postoperative diagnosis: Clinically localized adenocarcinoma of the prostate  Procedure: 1) Placement of fiducial markers into prostate                    2) Insertion of SpaceOAR hydrogel   Surgeon: Remi Haggard, MD  Radiation oncologist: Tammi Klippel  Anesthesia: General  EBL: Minimal  Complications: None  Indication: John Walters is a 76 y.o. gentleman with clinically localized prostate cancer. After discussing management options for treatment, he elected to proceed with radiotherapy. He presents today for the above procedures. The potential risks, complications, alternative options, and expected recovery course have been discussed in detail with the patient and he has provided informed consent to proceed.  Description of procedure: The patient was administered preoperative antibiotics, placed in the dorsal lithotomy position, and prepped and draped in the usual sterile fashion. Next, transrectal ultrasonography was utilized to visualize the prostate. Three gold fiducial markers were then placed into the prostate via transperineal needles under ultrasound guidance at the left apex, left base, and right mid gland under direct ultrasound guidance. A site in the midline was then selected on the perineum for placement of an 18 g needle with saline. The needle was advanced above the rectum and below Denonvillier's fascia to the mid gland and confirmed to be in the midline on transverse imaging. One cc of saline was injected confirming appropriate expansion of this space. A total of 5 cc of saline was then injected to open the space further bilaterally. The saline syringe was then removed and the SpaceOAR hydrogel was injected with good distribution bilaterally. He tolerated the procedure well and without complications. He was given a voiding trial prior to discharge from the PACU.  Remi Haggard, MD

## 2021-09-12 NOTE — Discharge Instructions (Signed)

## 2021-09-12 NOTE — Anesthesia Preprocedure Evaluation (Addendum)
Anesthesia Evaluation  Patient identified by MRN, date of birth, ID band Patient awake    Reviewed: Allergy & Precautions, NPO status , Patient's Chart, lab work & pertinent test results  Airway Mallampati: II  TM Distance: >3 FB Neck ROM: Full    Dental  (+) Teeth Intact, Dental Advisory Given   Pulmonary former smoker,    breath sounds clear to auscultation       Cardiovascular hypertension,  Rhythm:Regular Rate:Normal     Neuro/Psych  Headaches, negative psych ROS   GI/Hepatic Neg liver ROS, GERD  ,  Endo/Other  negative endocrine ROS  Renal/GU negative Renal ROS     Musculoskeletal  (+) Arthritis ,   Abdominal Normal abdominal exam  (+)   Peds  Hematology negative hematology ROS (+)   Anesthesia Other Findings   Reproductive/Obstetrics                            Anesthesia Physical Anesthesia Plan  ASA: 3  Anesthesia Plan: MAC   Post-op Pain Management:    Induction: Intravenous  PONV Risk Score and Plan: 2 and Ondansetron and Propofol infusion  Airway Management Planned: Natural Airway and Simple Face Mask  Additional Equipment: None  Intra-op Plan:   Post-operative Plan:   Informed Consent: I have reviewed the patients History and Physical, chart, labs and discussed the procedure including the risks, benefits and alternatives for the proposed anesthesia with the patient or authorized representative who has indicated his/her understanding and acceptance.       Plan Discussed with: CRNA  Anesthesia Plan Comments:        Anesthesia Quick Evaluation

## 2021-09-12 NOTE — Transfer of Care (Signed)
Immediate Anesthesia Transfer of Care Note  Patient: John Walters  Procedure(s) Performed: GOLD SEED IMPLANT (Prostate) SPACE OAR INSTILLATION (Prostate)  Patient Location: PACU  Anesthesia Type:MAC  Level of Consciousness: awake, alert , oriented and patient cooperative  Airway & Oxygen Therapy: Patient Spontanous Breathing  Post-op Assessment: Report given to RN and Post -op Vital signs reviewed and stable  Post vital signs: Reviewed and stable  Last Vitals:  Vitals Value Taken Time  BP 106/69 09/12/21 1134  Temp 36.5 C 09/12/21 1134  Pulse 57 09/12/21 1136  Resp 25 09/12/21 1136  SpO2 96 % 09/12/21 1136  Vitals shown include unvalidated device data.  Last Pain:  Vitals:   09/12/21 1134  TempSrc:   PainSc: 0-No pain      Patients Stated Pain Goal: 5 (99/35/70 1779)  Complications: No notable events documented.

## 2021-09-12 NOTE — Anesthesia Postprocedure Evaluation (Signed)
Anesthesia Post Note  Patient: John Walters  Procedure(s) Performed: GOLD SEED IMPLANT (Prostate) SPACE OAR INSTILLATION (Prostate)     Patient location during evaluation: PACU Anesthesia Type: MAC Level of consciousness: awake and alert Pain management: pain level controlled Vital Signs Assessment: post-procedure vital signs reviewed and stable Respiratory status: spontaneous breathing, nonlabored ventilation, respiratory function stable and patient connected to nasal cannula oxygen Cardiovascular status: stable and blood pressure returned to baseline Postop Assessment: no apparent nausea or vomiting Anesthetic complications: no   No notable events documented.  Last Vitals:  Vitals:   09/12/21 1211 09/12/21 1230  BP: (!) 154/95 (!) 156/90  Pulse: (!) 54 (!) 59  Resp: 17 15  Temp:  (!) 36.4 C  SpO2: 100% 100%    Last Pain:  Vitals:   09/12/21 1230  TempSrc:   PainSc: 2                  Effie Berkshire

## 2021-09-13 ENCOUNTER — Encounter (HOSPITAL_BASED_OUTPATIENT_CLINIC_OR_DEPARTMENT_OTHER): Payer: Self-pay | Admitting: Urology

## 2021-09-14 ENCOUNTER — Telehealth: Payer: Self-pay | Admitting: *Deleted

## 2021-09-14 NOTE — Telephone Encounter (Signed)
Called patient to remind of sim appt. for 09-15-21- arrival time- 10:45 am @ [facility], informed patient to arrive with a full bladder, spoke with patient and he is aware of this appt. and the instructions

## 2021-09-15 ENCOUNTER — Ambulatory Visit
Admission: RE | Admit: 2021-09-15 | Discharge: 2021-09-15 | Disposition: A | Discharge status: Home / Self Care | Payer: 59 | Source: Ambulatory Visit | Attending: Radiation Oncology | Admitting: Radiation Oncology

## 2021-09-15 DIAGNOSIS — Z510 Encounter for antineoplastic radiation therapy: Secondary | ICD-10-CM | POA: Insufficient documentation

## 2021-09-15 DIAGNOSIS — C61 Malignant neoplasm of prostate: Secondary | ICD-10-CM | POA: Diagnosis not present

## 2021-09-15 DIAGNOSIS — Z191 Hormone sensitive malignancy status: Secondary | ICD-10-CM | POA: Diagnosis not present

## 2021-09-15 NOTE — Progress Notes (Signed)
  Radiation Oncology         [phone]) [phone] ________________________________  Name: John Walters MRN: [id number]  Date: 09/15/2021  DOB: 1945-01-19  SIMULATION AND TREATMENT PLANNING NOTE    ICD-10-CM   1. Malignant neoplasm of prostate ([place])  C61       DIAGNOSIS:  76 y.o. gentleman with Stage T1c adenocarcinoma of the prostate with Gleason score of 4+3, and PSA of 5.97.  NARRATIVE:  The patient was brought to the [place].  Identity was confirmed.  All relevant records and images related to the planned course of therapy were reviewed.  The patient freely provided informed written consent to proceed with treatment after reviewing the details related to the planned course of therapy. The consent form was witnessed and verified by the simulation staff.  Then, the patient was set-up in a stable reproducible supine position for radiation therapy.  A vacuum lock pillow device was custom fabricated to position his legs in a reproducible immobilized position.  Then, supervised the performance of a urethrogram under sterile conditions to identify the prostatic apex.  CT images were obtained.  Surface markings were placed.  The CT images were loaded into the planning software.  Then the prostate target and avoidance structures including the rectum, bladder, bowel and hips were contoured.  Treatment planning then occurred.  The radiation prescription was entered and confirmed.  A total of one complex treatment devices was fabricated. I have requested : Intensity Modulated Radiotherapy (IMRT) is medically necessary for this case for the following reason:  Rectal sparing.  I have requested daily cone beam CT volumetric image gudiance to track gold fiducial posiitoning along with bladder and rectal filling, this is medically necessary to assure accurate positioning of high dose radiation.  PLAN:  The patient will receive 70 Gy in 28 fractions.  ________________________________  Sheral Apley  Tammi Klippel, M.D.

## 2021-09-18 DIAGNOSIS — C61 Malignant neoplasm of prostate: Secondary | ICD-10-CM | POA: Diagnosis not present

## 2021-09-18 DIAGNOSIS — Z510 Encounter for antineoplastic radiation therapy: Secondary | ICD-10-CM | POA: Diagnosis not present

## 2021-09-18 DIAGNOSIS — Z191 Hormone sensitive malignancy status: Secondary | ICD-10-CM | POA: Diagnosis not present

## 2021-09-27 ENCOUNTER — Ambulatory Visit: Payer: 59

## 2021-09-28 ENCOUNTER — Other Ambulatory Visit: Payer: Self-pay

## 2021-09-28 ENCOUNTER — Ambulatory Visit: Payer: 59

## 2021-09-28 ENCOUNTER — Ambulatory Visit
Admission: RE | Admit: 2021-09-28 | Discharge: 2021-09-28 | Disposition: A | Discharge status: Home / Self Care | Payer: 59 | Source: Ambulatory Visit | Attending: Radiation Oncology | Admitting: Radiation Oncology

## 2021-09-28 DIAGNOSIS — Z191 Hormone sensitive malignancy status: Secondary | ICD-10-CM | POA: Diagnosis not present

## 2021-09-28 DIAGNOSIS — C61 Malignant neoplasm of prostate: Secondary | ICD-10-CM | POA: Diagnosis not present

## 2021-09-28 DIAGNOSIS — Z510 Encounter for antineoplastic radiation therapy: Secondary | ICD-10-CM | POA: Diagnosis not present

## 2021-09-28 LAB — RAD ONC ARIA SESSION SUMMARY
Course Elapsed Days: 0
Plan Fractions Treated to Date: 1
Plan Prescribed Dose Per Fraction: 2.5 Gy
Plan Total Fractions Prescribed: 28
Plan Total Prescribed Dose: 70 Gy
Reference Point Dosage Given to Date: 2.5 Gy
Reference Point Session Dosage Given: 2.5 Gy
Session Number: 1

## 2021-09-29 ENCOUNTER — Ambulatory Visit
Admission: RE | Admit: 2021-09-29 | Discharge: 2021-09-29 | Disposition: A | Discharge status: Home / Self Care | Payer: 59 | Source: Ambulatory Visit | Attending: Radiation Oncology | Admitting: Radiation Oncology

## 2021-09-29 ENCOUNTER — Other Ambulatory Visit: Payer: Self-pay

## 2021-09-29 DIAGNOSIS — C61 Malignant neoplasm of prostate: Secondary | ICD-10-CM | POA: Diagnosis not present

## 2021-09-29 DIAGNOSIS — Z191 Hormone sensitive malignancy status: Secondary | ICD-10-CM | POA: Diagnosis not present

## 2021-09-29 DIAGNOSIS — Z510 Encounter for antineoplastic radiation therapy: Secondary | ICD-10-CM | POA: Diagnosis not present

## 2021-09-29 LAB — RAD ONC ARIA SESSION SUMMARY
Course Elapsed Days: 1
Plan Fractions Treated to Date: 2
Plan Prescribed Dose Per Fraction: 2.5 Gy
Plan Total Fractions Prescribed: 28
Plan Total Prescribed Dose: 70 Gy
Reference Point Dosage Given to Date: 5 Gy
Reference Point Session Dosage Given: 2.5 Gy
Session Number: 2

## 2021-10-02 ENCOUNTER — Other Ambulatory Visit: Payer: Self-pay

## 2021-10-02 ENCOUNTER — Ambulatory Visit
Admission: RE | Admit: 2021-10-02 | Discharge: 2021-10-02 | Disposition: A | Discharge status: Home / Self Care | Payer: 59 | Source: Ambulatory Visit | Attending: Radiation Oncology | Admitting: Radiation Oncology

## 2021-10-02 DIAGNOSIS — Z191 Hormone sensitive malignancy status: Secondary | ICD-10-CM | POA: Diagnosis not present

## 2021-10-02 DIAGNOSIS — C61 Malignant neoplasm of prostate: Secondary | ICD-10-CM | POA: Diagnosis not present

## 2021-10-02 DIAGNOSIS — Z510 Encounter for antineoplastic radiation therapy: Secondary | ICD-10-CM | POA: Diagnosis not present

## 2021-10-02 LAB — RAD ONC ARIA SESSION SUMMARY
Course Elapsed Days: 4
Plan Fractions Treated to Date: 3
Plan Prescribed Dose Per Fraction: 2.5 Gy
Plan Total Fractions Prescribed: 28
Plan Total Prescribed Dose: 70 Gy
Reference Point Dosage Given to Date: 7.5 Gy
Reference Point Session Dosage Given: 2.5 Gy
Session Number: 3

## 2021-10-03 ENCOUNTER — Ambulatory Visit
Admission: RE | Admit: 2021-10-03 | Discharge: 2021-10-03 | Disposition: A | Discharge status: Home / Self Care | Payer: 59 | Source: Ambulatory Visit | Attending: Radiation Oncology | Admitting: Radiation Oncology

## 2021-10-03 ENCOUNTER — Other Ambulatory Visit: Payer: Self-pay

## 2021-10-03 DIAGNOSIS — Z191 Hormone sensitive malignancy status: Secondary | ICD-10-CM | POA: Diagnosis not present

## 2021-10-03 DIAGNOSIS — C61 Malignant neoplasm of prostate: Secondary | ICD-10-CM | POA: Diagnosis not present

## 2021-10-03 DIAGNOSIS — Z510 Encounter for antineoplastic radiation therapy: Secondary | ICD-10-CM | POA: Diagnosis not present

## 2021-10-03 LAB — RAD ONC ARIA SESSION SUMMARY
Course Elapsed Days: 5
Plan Fractions Treated to Date: 4
Plan Prescribed Dose Per Fraction: 2.5 Gy
Plan Total Fractions Prescribed: 28
Plan Total Prescribed Dose: 70 Gy
Reference Point Dosage Given to Date: 10 Gy
Reference Point Session Dosage Given: 2.5 Gy
Session Number: 4

## 2021-10-04 ENCOUNTER — Other Ambulatory Visit: Payer: Self-pay

## 2021-10-04 ENCOUNTER — Ambulatory Visit
Admission: RE | Admit: 2021-10-04 | Discharge: 2021-10-04 | Disposition: A | Discharge status: Home / Self Care | Payer: 59 | Source: Ambulatory Visit | Attending: Radiation Oncology | Admitting: Radiation Oncology

## 2021-10-04 DIAGNOSIS — Z510 Encounter for antineoplastic radiation therapy: Secondary | ICD-10-CM | POA: Diagnosis not present

## 2021-10-04 DIAGNOSIS — C61 Malignant neoplasm of prostate: Secondary | ICD-10-CM | POA: Diagnosis not present

## 2021-10-04 DIAGNOSIS — Z191 Hormone sensitive malignancy status: Secondary | ICD-10-CM | POA: Diagnosis not present

## 2021-10-04 LAB — RAD ONC ARIA SESSION SUMMARY
Course Elapsed Days: 6
Plan Fractions Treated to Date: 5
Plan Prescribed Dose Per Fraction: 2.5 Gy
Plan Total Fractions Prescribed: 28
Plan Total Prescribed Dose: 70 Gy
Reference Point Dosage Given to Date: 12.5 Gy
Reference Point Session Dosage Given: 2.5 Gy
Session Number: 5

## 2021-10-05 ENCOUNTER — Ambulatory Visit
Admission: RE | Admit: 2021-10-05 | Discharge: 2021-10-05 | Disposition: A | Discharge status: Home / Self Care | Payer: 59 | Source: Ambulatory Visit | Attending: Radiation Oncology | Admitting: Radiation Oncology

## 2021-10-05 ENCOUNTER — Other Ambulatory Visit: Payer: Self-pay

## 2021-10-05 DIAGNOSIS — Z510 Encounter for antineoplastic radiation therapy: Secondary | ICD-10-CM | POA: Diagnosis not present

## 2021-10-05 DIAGNOSIS — C61 Malignant neoplasm of prostate: Secondary | ICD-10-CM | POA: Diagnosis not present

## 2021-10-05 DIAGNOSIS — Z191 Hormone sensitive malignancy status: Secondary | ICD-10-CM | POA: Diagnosis not present

## 2021-10-05 LAB — RAD ONC ARIA SESSION SUMMARY
Course Elapsed Days: 7
Plan Fractions Treated to Date: 6
Plan Prescribed Dose Per Fraction: 2.5 Gy
Plan Total Fractions Prescribed: 28
Plan Total Prescribed Dose: 70 Gy
Reference Point Dosage Given to Date: 15 Gy
Reference Point Session Dosage Given: 2.5 Gy
Session Number: 6

## 2021-10-06 ENCOUNTER — Ambulatory Visit
Admission: RE | Admit: 2021-10-06 | Discharge: 2021-10-06 | Disposition: A | Discharge status: Home / Self Care | Payer: 59 | Source: Ambulatory Visit | Attending: Radiation Oncology | Admitting: Radiation Oncology

## 2021-10-06 ENCOUNTER — Other Ambulatory Visit: Payer: Self-pay

## 2021-10-06 ENCOUNTER — Other Ambulatory Visit (HOSPITAL_COMMUNITY): Payer: Self-pay

## 2021-10-06 DIAGNOSIS — Z510 Encounter for antineoplastic radiation therapy: Secondary | ICD-10-CM | POA: Diagnosis not present

## 2021-10-06 DIAGNOSIS — Z191 Hormone sensitive malignancy status: Secondary | ICD-10-CM | POA: Diagnosis not present

## 2021-10-06 DIAGNOSIS — C61 Malignant neoplasm of prostate: Secondary | ICD-10-CM | POA: Diagnosis not present

## 2021-10-06 LAB — RAD ONC ARIA SESSION SUMMARY
Course Elapsed Days: 8
Plan Fractions Treated to Date: 7
Plan Prescribed Dose Per Fraction: 2.5 Gy
Plan Total Fractions Prescribed: 28
Plan Total Prescribed Dose: 70 Gy
Reference Point Dosage Given to Date: 17.5 Gy
Reference Point Session Dosage Given: 2.5 Gy
Session Number: 7

## 2021-10-09 ENCOUNTER — Other Ambulatory Visit: Payer: Self-pay

## 2021-10-09 ENCOUNTER — Other Ambulatory Visit (HOSPITAL_COMMUNITY): Payer: Self-pay

## 2021-10-09 ENCOUNTER — Ambulatory Visit
Admission: RE | Admit: 2021-10-09 | Discharge: 2021-10-09 | Disposition: A | Discharge status: Home / Self Care | Payer: 59 | Source: Ambulatory Visit | Attending: Radiation Oncology | Admitting: Radiation Oncology

## 2021-10-09 DIAGNOSIS — Z191 Hormone sensitive malignancy status: Secondary | ICD-10-CM | POA: Diagnosis not present

## 2021-10-09 DIAGNOSIS — C61 Malignant neoplasm of prostate: Secondary | ICD-10-CM | POA: Diagnosis not present

## 2021-10-09 DIAGNOSIS — Z510 Encounter for antineoplastic radiation therapy: Secondary | ICD-10-CM | POA: Diagnosis not present

## 2021-10-09 LAB — RAD ONC ARIA SESSION SUMMARY
Course Elapsed Days: 11
Plan Fractions Treated to Date: 8
Plan Prescribed Dose Per Fraction: 2.5 Gy
Plan Total Fractions Prescribed: 28
Plan Total Prescribed Dose: 70 Gy
Reference Point Dosage Given to Date: 20 Gy
Reference Point Session Dosage Given: 2.5 Gy
Session Number: 8

## 2021-10-10 ENCOUNTER — Ambulatory Visit
Admission: RE | Admit: 2021-10-10 | Discharge: 2021-10-10 | Disposition: A | Discharge status: Home / Self Care | Payer: 59 | Source: Ambulatory Visit | Attending: Radiation Oncology | Admitting: Radiation Oncology

## 2021-10-10 ENCOUNTER — Other Ambulatory Visit: Payer: Self-pay

## 2021-10-10 DIAGNOSIS — C61 Malignant neoplasm of prostate: Secondary | ICD-10-CM | POA: Diagnosis not present

## 2021-10-10 DIAGNOSIS — Z191 Hormone sensitive malignancy status: Secondary | ICD-10-CM | POA: Diagnosis not present

## 2021-10-10 DIAGNOSIS — Z510 Encounter for antineoplastic radiation therapy: Secondary | ICD-10-CM | POA: Diagnosis not present

## 2021-10-10 LAB — RAD ONC ARIA SESSION SUMMARY
Course Elapsed Days: 12
Plan Fractions Treated to Date: 9
Plan Prescribed Dose Per Fraction: 2.5 Gy
Plan Total Fractions Prescribed: 28
Plan Total Prescribed Dose: 70 Gy
Reference Point Dosage Given to Date: 22.5 Gy
Reference Point Session Dosage Given: 2.5 Gy
Session Number: 9

## 2021-10-11 ENCOUNTER — Other Ambulatory Visit: Payer: Self-pay

## 2021-10-11 ENCOUNTER — Ambulatory Visit
Admission: RE | Admit: 2021-10-11 | Discharge: 2021-10-11 | Disposition: A | Discharge status: Home / Self Care | Payer: 59 | Source: Ambulatory Visit | Attending: Radiation Oncology | Admitting: Radiation Oncology

## 2021-10-11 DIAGNOSIS — C61 Malignant neoplasm of prostate: Secondary | ICD-10-CM | POA: Diagnosis not present

## 2021-10-11 DIAGNOSIS — Z191 Hormone sensitive malignancy status: Secondary | ICD-10-CM | POA: Diagnosis not present

## 2021-10-11 DIAGNOSIS — Z510 Encounter for antineoplastic radiation therapy: Secondary | ICD-10-CM | POA: Diagnosis not present

## 2021-10-11 LAB — RAD ONC ARIA SESSION SUMMARY
Course Elapsed Days: 13
Plan Fractions Treated to Date: 10
Plan Prescribed Dose Per Fraction: 2.5 Gy
Plan Total Fractions Prescribed: 28
Plan Total Prescribed Dose: 70 Gy
Reference Point Dosage Given to Date: 25 Gy
Reference Point Session Dosage Given: 2.5 Gy
Session Number: 10

## 2021-10-12 ENCOUNTER — Other Ambulatory Visit: Payer: Self-pay

## 2021-10-12 ENCOUNTER — Ambulatory Visit
Admission: RE | Admit: 2021-10-12 | Discharge: 2021-10-12 | Disposition: A | Discharge status: Home / Self Care | Payer: 59 | Source: Ambulatory Visit | Attending: Radiation Oncology | Admitting: Radiation Oncology

## 2021-10-12 DIAGNOSIS — Z191 Hormone sensitive malignancy status: Secondary | ICD-10-CM | POA: Diagnosis not present

## 2021-10-12 DIAGNOSIS — C61 Malignant neoplasm of prostate: Secondary | ICD-10-CM | POA: Diagnosis not present

## 2021-10-12 DIAGNOSIS — Z510 Encounter for antineoplastic radiation therapy: Secondary | ICD-10-CM | POA: Diagnosis not present

## 2021-10-12 LAB — RAD ONC ARIA SESSION SUMMARY
Course Elapsed Days: 14
Plan Fractions Treated to Date: 11
Plan Prescribed Dose Per Fraction: 2.5 Gy
Plan Total Fractions Prescribed: 28
Plan Total Prescribed Dose: 70 Gy
Reference Point Dosage Given to Date: 27.5 Gy
Reference Point Session Dosage Given: 2.5 Gy
Session Number: 11

## 2021-10-13 ENCOUNTER — Other Ambulatory Visit: Payer: Self-pay

## 2021-10-13 ENCOUNTER — Ambulatory Visit
Admission: RE | Admit: 2021-10-13 | Discharge: 2021-10-13 | Disposition: A | Discharge status: Home / Self Care | Payer: 59 | Source: Ambulatory Visit | Attending: Radiation Oncology | Admitting: Radiation Oncology

## 2021-10-13 DIAGNOSIS — Z191 Hormone sensitive malignancy status: Secondary | ICD-10-CM | POA: Diagnosis not present

## 2021-10-13 DIAGNOSIS — Z510 Encounter for antineoplastic radiation therapy: Secondary | ICD-10-CM | POA: Diagnosis not present

## 2021-10-13 DIAGNOSIS — C61 Malignant neoplasm of prostate: Secondary | ICD-10-CM | POA: Diagnosis not present

## 2021-10-13 LAB — RAD ONC ARIA SESSION SUMMARY
Course Elapsed Days: 15
Plan Fractions Treated to Date: 12
Plan Prescribed Dose Per Fraction: 2.5 Gy
Plan Total Fractions Prescribed: 28
Plan Total Prescribed Dose: 70 Gy
Reference Point Dosage Given to Date: 30 Gy
Reference Point Session Dosage Given: 2.5 Gy
Session Number: 12

## 2021-10-16 ENCOUNTER — Ambulatory Visit
Admission: RE | Admit: 2021-10-16 | Discharge: 2021-10-16 | Disposition: A | Discharge status: Home / Self Care | Payer: 59 | Source: Ambulatory Visit | Attending: Radiation Oncology | Admitting: Radiation Oncology

## 2021-10-16 ENCOUNTER — Other Ambulatory Visit: Payer: Self-pay

## 2021-10-16 DIAGNOSIS — Z191 Hormone sensitive malignancy status: Secondary | ICD-10-CM | POA: Diagnosis not present

## 2021-10-16 DIAGNOSIS — Z510 Encounter for antineoplastic radiation therapy: Secondary | ICD-10-CM | POA: Diagnosis not present

## 2021-10-16 DIAGNOSIS — C61 Malignant neoplasm of prostate: Secondary | ICD-10-CM | POA: Diagnosis not present

## 2021-10-16 LAB — RAD ONC ARIA SESSION SUMMARY
Course Elapsed Days: 18
Plan Fractions Treated to Date: 13
Plan Prescribed Dose Per Fraction: 2.5 Gy
Plan Total Fractions Prescribed: 28
Plan Total Prescribed Dose: 70 Gy
Reference Point Dosage Given to Date: 32.5 Gy
Reference Point Session Dosage Given: 2.5 Gy
Session Number: 13

## 2021-10-17 ENCOUNTER — Ambulatory Visit
Admission: RE | Admit: 2021-10-17 | Discharge: 2021-10-17 | Disposition: A | Discharge status: Home / Self Care | Payer: 59 | Source: Ambulatory Visit | Attending: Radiation Oncology | Admitting: Radiation Oncology

## 2021-10-17 ENCOUNTER — Other Ambulatory Visit: Payer: Self-pay

## 2021-10-17 DIAGNOSIS — Z191 Hormone sensitive malignancy status: Secondary | ICD-10-CM | POA: Diagnosis not present

## 2021-10-17 DIAGNOSIS — Z510 Encounter for antineoplastic radiation therapy: Secondary | ICD-10-CM | POA: Diagnosis not present

## 2021-10-17 DIAGNOSIS — C61 Malignant neoplasm of prostate: Secondary | ICD-10-CM | POA: Diagnosis not present

## 2021-10-17 LAB — RAD ONC ARIA SESSION SUMMARY
Course Elapsed Days: 19
Plan Fractions Treated to Date: 14
Plan Prescribed Dose Per Fraction: 2.5 Gy
Plan Total Fractions Prescribed: 28
Plan Total Prescribed Dose: 70 Gy
Reference Point Dosage Given to Date: 35 Gy
Reference Point Session Dosage Given: 2.5 Gy
Session Number: 14

## 2021-10-18 ENCOUNTER — Other Ambulatory Visit: Payer: Self-pay

## 2021-10-18 ENCOUNTER — Ambulatory Visit
Admission: RE | Admit: 2021-10-18 | Discharge: 2021-10-18 | Disposition: A | Discharge status: Home / Self Care | Payer: 59 | Source: Ambulatory Visit | Attending: Radiation Oncology | Admitting: Radiation Oncology

## 2021-10-18 DIAGNOSIS — C61 Malignant neoplasm of prostate: Secondary | ICD-10-CM | POA: Diagnosis not present

## 2021-10-18 DIAGNOSIS — Z510 Encounter for antineoplastic radiation therapy: Secondary | ICD-10-CM | POA: Diagnosis not present

## 2021-10-18 DIAGNOSIS — Z191 Hormone sensitive malignancy status: Secondary | ICD-10-CM | POA: Diagnosis not present

## 2021-10-18 LAB — RAD ONC ARIA SESSION SUMMARY
Course Elapsed Days: 20
Plan Fractions Treated to Date: 15
Plan Prescribed Dose Per Fraction: 2.5 Gy
Plan Total Fractions Prescribed: 28
Plan Total Prescribed Dose: 70 Gy
Reference Point Dosage Given to Date: 37.5 Gy
Reference Point Session Dosage Given: 2.5 Gy
Session Number: 15

## 2021-10-19 ENCOUNTER — Ambulatory Visit
Admission: RE | Admit: 2021-10-19 | Discharge: 2021-10-19 | Disposition: A | Discharge status: Home / Self Care | Payer: 59 | Source: Ambulatory Visit | Attending: Radiation Oncology | Admitting: Radiation Oncology

## 2021-10-19 ENCOUNTER — Other Ambulatory Visit: Payer: Self-pay

## 2021-10-19 DIAGNOSIS — Z191 Hormone sensitive malignancy status: Secondary | ICD-10-CM | POA: Diagnosis not present

## 2021-10-19 DIAGNOSIS — C61 Malignant neoplasm of prostate: Secondary | ICD-10-CM | POA: Diagnosis not present

## 2021-10-19 DIAGNOSIS — Z510 Encounter for antineoplastic radiation therapy: Secondary | ICD-10-CM | POA: Diagnosis not present

## 2021-10-19 LAB — RAD ONC ARIA SESSION SUMMARY
Course Elapsed Days: 21
Plan Fractions Treated to Date: 16
Plan Prescribed Dose Per Fraction: 2.5 Gy
Plan Total Fractions Prescribed: 28
Plan Total Prescribed Dose: 70 Gy
Reference Point Dosage Given to Date: 40 Gy
Reference Point Session Dosage Given: 2.5 Gy
Session Number: 16

## 2021-10-20 ENCOUNTER — Ambulatory Visit
Admission: RE | Admit: 2021-10-20 | Discharge: 2021-10-20 | Disposition: A | Discharge status: Home / Self Care | Payer: 59 | Source: Ambulatory Visit | Attending: Radiation Oncology | Admitting: Radiation Oncology

## 2021-10-20 ENCOUNTER — Other Ambulatory Visit: Payer: Self-pay

## 2021-10-20 DIAGNOSIS — Z191 Hormone sensitive malignancy status: Secondary | ICD-10-CM | POA: Diagnosis not present

## 2021-10-20 DIAGNOSIS — Z510 Encounter for antineoplastic radiation therapy: Secondary | ICD-10-CM | POA: Diagnosis not present

## 2021-10-20 DIAGNOSIS — C61 Malignant neoplasm of prostate: Secondary | ICD-10-CM | POA: Diagnosis not present

## 2021-10-20 LAB — RAD ONC ARIA SESSION SUMMARY
Course Elapsed Days: 22
Plan Fractions Treated to Date: 17
Plan Prescribed Dose Per Fraction: 2.5 Gy
Plan Total Fractions Prescribed: 28
Plan Total Prescribed Dose: 70 Gy
Reference Point Dosage Given to Date: 42.5 Gy
Reference Point Session Dosage Given: 2.5 Gy
Session Number: 17

## 2021-10-23 ENCOUNTER — Other Ambulatory Visit: Payer: Self-pay

## 2021-10-23 ENCOUNTER — Ambulatory Visit
Admission: RE | Admit: 2021-10-23 | Discharge: 2021-10-23 | Disposition: A | Discharge status: Home / Self Care | Payer: 59 | Source: Ambulatory Visit | Attending: Radiation Oncology | Admitting: Radiation Oncology

## 2021-10-23 DIAGNOSIS — Z191 Hormone sensitive malignancy status: Secondary | ICD-10-CM | POA: Diagnosis not present

## 2021-10-23 DIAGNOSIS — C61 Malignant neoplasm of prostate: Secondary | ICD-10-CM | POA: Diagnosis not present

## 2021-10-23 DIAGNOSIS — Z510 Encounter for antineoplastic radiation therapy: Secondary | ICD-10-CM | POA: Diagnosis not present

## 2021-10-23 LAB — RAD ONC ARIA SESSION SUMMARY
Course Elapsed Days: 25
Plan Fractions Treated to Date: 18
Plan Prescribed Dose Per Fraction: 2.5 Gy
Plan Total Fractions Prescribed: 28
Plan Total Prescribed Dose: 70 Gy
Reference Point Dosage Given to Date: 45 Gy
Reference Point Session Dosage Given: 2.5 Gy
Session Number: 18

## 2021-10-24 ENCOUNTER — Other Ambulatory Visit: Payer: Self-pay

## 2021-10-24 ENCOUNTER — Ambulatory Visit
Admission: RE | Admit: 2021-10-24 | Discharge: 2021-10-24 | Disposition: A | Discharge status: Home / Self Care | Payer: 59 | Source: Ambulatory Visit | Attending: Radiation Oncology | Admitting: Radiation Oncology

## 2021-10-24 DIAGNOSIS — C61 Malignant neoplasm of prostate: Secondary | ICD-10-CM | POA: Diagnosis not present

## 2021-10-24 DIAGNOSIS — Z191 Hormone sensitive malignancy status: Secondary | ICD-10-CM | POA: Diagnosis not present

## 2021-10-24 DIAGNOSIS — Z510 Encounter for antineoplastic radiation therapy: Secondary | ICD-10-CM | POA: Diagnosis not present

## 2021-10-24 LAB — RAD ONC ARIA SESSION SUMMARY
Course Elapsed Days: 26
Plan Fractions Treated to Date: 19
Plan Prescribed Dose Per Fraction: 2.5 Gy
Plan Total Fractions Prescribed: 28
Plan Total Prescribed Dose: 70 Gy
Reference Point Dosage Given to Date: 47.5 Gy
Reference Point Session Dosage Given: 2.5 Gy
Session Number: 19

## 2021-10-25 ENCOUNTER — Other Ambulatory Visit: Payer: Self-pay

## 2021-10-25 ENCOUNTER — Ambulatory Visit
Admission: RE | Admit: 2021-10-25 | Discharge: 2021-10-25 | Disposition: A | Discharge status: Home / Self Care | Payer: 59 | Source: Ambulatory Visit | Attending: Radiation Oncology | Admitting: Radiation Oncology

## 2021-10-25 DIAGNOSIS — Z191 Hormone sensitive malignancy status: Secondary | ICD-10-CM | POA: Diagnosis not present

## 2021-10-25 DIAGNOSIS — C61 Malignant neoplasm of prostate: Secondary | ICD-10-CM | POA: Diagnosis not present

## 2021-10-25 DIAGNOSIS — Z510 Encounter for antineoplastic radiation therapy: Secondary | ICD-10-CM | POA: Diagnosis not present

## 2021-10-25 LAB — RAD ONC ARIA SESSION SUMMARY
Course Elapsed Days: 27
Plan Fractions Treated to Date: 20
Plan Prescribed Dose Per Fraction: 2.5 Gy
Plan Total Fractions Prescribed: 28
Plan Total Prescribed Dose: 70 Gy
Reference Point Dosage Given to Date: 50 Gy
Reference Point Session Dosage Given: 2.5 Gy
Session Number: 20

## 2021-10-26 ENCOUNTER — Other Ambulatory Visit: Payer: Self-pay

## 2021-10-26 ENCOUNTER — Ambulatory Visit
Admission: RE | Admit: 2021-10-26 | Discharge: 2021-10-26 | Disposition: A | Discharge status: Home / Self Care | Payer: 59 | Source: Ambulatory Visit | Attending: Radiation Oncology | Admitting: Radiation Oncology

## 2021-10-26 DIAGNOSIS — C61 Malignant neoplasm of prostate: Secondary | ICD-10-CM | POA: Diagnosis not present

## 2021-10-26 DIAGNOSIS — Z191 Hormone sensitive malignancy status: Secondary | ICD-10-CM | POA: Diagnosis not present

## 2021-10-26 DIAGNOSIS — Z510 Encounter for antineoplastic radiation therapy: Secondary | ICD-10-CM | POA: Diagnosis not present

## 2021-10-26 LAB — RAD ONC ARIA SESSION SUMMARY
Course Elapsed Days: 28
Plan Fractions Treated to Date: 21
Plan Prescribed Dose Per Fraction: 2.5 Gy
Plan Total Fractions Prescribed: 28
Plan Total Prescribed Dose: 70 Gy
Reference Point Dosage Given to Date: 52.5 Gy
Reference Point Session Dosage Given: 2.5 Gy
Session Number: 21

## 2021-10-27 ENCOUNTER — Other Ambulatory Visit: Payer: Self-pay

## 2021-10-27 ENCOUNTER — Ambulatory Visit
Admission: RE | Admit: 2021-10-27 | Discharge: 2021-10-27 | Disposition: A | Discharge status: Home / Self Care | Payer: 59 | Source: Ambulatory Visit | Attending: Radiation Oncology | Admitting: Radiation Oncology

## 2021-10-27 DIAGNOSIS — Z191 Hormone sensitive malignancy status: Secondary | ICD-10-CM | POA: Diagnosis not present

## 2021-10-27 DIAGNOSIS — Z510 Encounter for antineoplastic radiation therapy: Secondary | ICD-10-CM | POA: Diagnosis not present

## 2021-10-27 DIAGNOSIS — C61 Malignant neoplasm of prostate: Secondary | ICD-10-CM | POA: Diagnosis not present

## 2021-10-27 LAB — RAD ONC ARIA SESSION SUMMARY
Course Elapsed Days: 29
Plan Fractions Treated to Date: 22
Plan Prescribed Dose Per Fraction: 2.5 Gy
Plan Total Fractions Prescribed: 28
Plan Total Prescribed Dose: 70 Gy
Reference Point Dosage Given to Date: 55 Gy
Reference Point Session Dosage Given: 2.5 Gy
Session Number: 22

## 2021-10-30 ENCOUNTER — Other Ambulatory Visit: Payer: Self-pay

## 2021-10-30 ENCOUNTER — Ambulatory Visit
Admission: RE | Admit: 2021-10-30 | Discharge: 2021-10-30 | Disposition: A | Discharge status: Home / Self Care | Payer: 59 | Source: Ambulatory Visit | Attending: Radiation Oncology | Admitting: Radiation Oncology

## 2021-10-30 DIAGNOSIS — Z191 Hormone sensitive malignancy status: Secondary | ICD-10-CM | POA: Diagnosis not present

## 2021-10-30 DIAGNOSIS — C61 Malignant neoplasm of prostate: Secondary | ICD-10-CM | POA: Diagnosis not present

## 2021-10-30 DIAGNOSIS — Z510 Encounter for antineoplastic radiation therapy: Secondary | ICD-10-CM | POA: Diagnosis not present

## 2021-10-30 LAB — RAD ONC ARIA SESSION SUMMARY
Course Elapsed Days: 32
Plan Fractions Treated to Date: 23
Plan Prescribed Dose Per Fraction: 2.5 Gy
Plan Total Fractions Prescribed: 28
Plan Total Prescribed Dose: 70 Gy
Reference Point Dosage Given to Date: 57.5 Gy
Reference Point Session Dosage Given: 2.5 Gy
Session Number: 23

## 2021-10-31 ENCOUNTER — Other Ambulatory Visit: Payer: Self-pay

## 2021-10-31 ENCOUNTER — Ambulatory Visit
Admission: RE | Admit: 2021-10-31 | Discharge: 2021-10-31 | Disposition: A | Discharge status: Home / Self Care | Payer: 59 | Source: Ambulatory Visit | Attending: Radiation Oncology | Admitting: Radiation Oncology

## 2021-10-31 DIAGNOSIS — Z510 Encounter for antineoplastic radiation therapy: Secondary | ICD-10-CM | POA: Diagnosis not present

## 2021-10-31 DIAGNOSIS — Z191 Hormone sensitive malignancy status: Secondary | ICD-10-CM | POA: Diagnosis not present

## 2021-10-31 DIAGNOSIS — C61 Malignant neoplasm of prostate: Secondary | ICD-10-CM | POA: Diagnosis not present

## 2021-10-31 LAB — RAD ONC ARIA SESSION SUMMARY
Course Elapsed Days: 33
Plan Fractions Treated to Date: 24
Plan Prescribed Dose Per Fraction: 2.5 Gy
Plan Total Fractions Prescribed: 28
Plan Total Prescribed Dose: 70 Gy
Reference Point Dosage Given to Date: 60 Gy
Reference Point Session Dosage Given: 2.5 Gy
Session Number: 24

## 2021-11-01 ENCOUNTER — Ambulatory Visit
Admission: RE | Admit: 2021-11-01 | Discharge: 2021-11-01 | Disposition: A | Discharge status: Home / Self Care | Payer: 59 | Source: Ambulatory Visit | Attending: Radiation Oncology | Admitting: Radiation Oncology

## 2021-11-01 ENCOUNTER — Other Ambulatory Visit: Payer: Self-pay

## 2021-11-01 DIAGNOSIS — Z191 Hormone sensitive malignancy status: Secondary | ICD-10-CM | POA: Diagnosis not present

## 2021-11-01 DIAGNOSIS — Z510 Encounter for antineoplastic radiation therapy: Secondary | ICD-10-CM | POA: Diagnosis not present

## 2021-11-01 DIAGNOSIS — C61 Malignant neoplasm of prostate: Secondary | ICD-10-CM | POA: Diagnosis not present

## 2021-11-01 LAB — RAD ONC ARIA SESSION SUMMARY
Course Elapsed Days: 34
Plan Fractions Treated to Date: 25
Plan Prescribed Dose Per Fraction: 2.5 Gy
Plan Total Fractions Prescribed: 28
Plan Total Prescribed Dose: 70 Gy
Reference Point Dosage Given to Date: 62.5 Gy
Reference Point Session Dosage Given: 2.5 Gy
Session Number: 25

## 2021-11-02 ENCOUNTER — Ambulatory Visit
Admission: RE | Admit: 2021-11-02 | Discharge: 2021-11-02 | Disposition: A | Discharge status: Home / Self Care | Payer: 59 | Source: Ambulatory Visit | Attending: Radiation Oncology | Admitting: Radiation Oncology

## 2021-11-02 ENCOUNTER — Other Ambulatory Visit: Payer: Self-pay

## 2021-11-02 DIAGNOSIS — Z191 Hormone sensitive malignancy status: Secondary | ICD-10-CM | POA: Diagnosis not present

## 2021-11-02 DIAGNOSIS — Z510 Encounter for antineoplastic radiation therapy: Secondary | ICD-10-CM | POA: Diagnosis not present

## 2021-11-02 DIAGNOSIS — C61 Malignant neoplasm of prostate: Secondary | ICD-10-CM | POA: Diagnosis not present

## 2021-11-02 LAB — RAD ONC ARIA SESSION SUMMARY
Course Elapsed Days: 35
Plan Fractions Treated to Date: 26
Plan Prescribed Dose Per Fraction: 2.5 Gy
Plan Total Fractions Prescribed: 28
Plan Total Prescribed Dose: 70 Gy
Reference Point Dosage Given to Date: 65 Gy
Reference Point Session Dosage Given: 2.5 Gy
Session Number: 26

## 2021-11-03 ENCOUNTER — Ambulatory Visit
Admission: RE | Admit: 2021-11-03 | Discharge: 2021-11-03 | Disposition: A | Discharge status: Home / Self Care | Payer: 59 | Source: Ambulatory Visit | Attending: Radiation Oncology | Admitting: Radiation Oncology

## 2021-11-03 ENCOUNTER — Other Ambulatory Visit: Payer: Self-pay

## 2021-11-03 ENCOUNTER — Ambulatory Visit: Payer: 59

## 2021-11-03 DIAGNOSIS — Z191 Hormone sensitive malignancy status: Secondary | ICD-10-CM | POA: Diagnosis not present

## 2021-11-03 DIAGNOSIS — C61 Malignant neoplasm of prostate: Secondary | ICD-10-CM | POA: Diagnosis not present

## 2021-11-03 DIAGNOSIS — Z510 Encounter for antineoplastic radiation therapy: Secondary | ICD-10-CM | POA: Diagnosis not present

## 2021-11-03 LAB — RAD ONC ARIA SESSION SUMMARY
Course Elapsed Days: 36
Plan Fractions Treated to Date: 27
Plan Prescribed Dose Per Fraction: 2.5 Gy
Plan Total Fractions Prescribed: 28
Plan Total Prescribed Dose: 70 Gy
Reference Point Dosage Given to Date: 67.5 Gy
Reference Point Session Dosage Given: 2.5 Gy
Session Number: 27

## 2021-11-06 ENCOUNTER — Other Ambulatory Visit: Payer: Self-pay

## 2021-11-06 ENCOUNTER — Ambulatory Visit
Admission: RE | Admit: 2021-11-06 | Discharge: 2021-11-06 | Disposition: A | Discharge status: Home / Self Care | Payer: 59 | Source: Ambulatory Visit | Attending: Radiation Oncology | Admitting: Radiation Oncology

## 2021-11-06 ENCOUNTER — Encounter: Payer: Self-pay | Admitting: Urology

## 2021-11-06 DIAGNOSIS — Z191 Hormone sensitive malignancy status: Secondary | ICD-10-CM | POA: Diagnosis not present

## 2021-11-06 DIAGNOSIS — C61 Malignant neoplasm of prostate: Secondary | ICD-10-CM | POA: Diagnosis not present

## 2021-11-06 DIAGNOSIS — Z510 Encounter for antineoplastic radiation therapy: Secondary | ICD-10-CM | POA: Diagnosis not present

## 2021-11-06 LAB — RAD ONC ARIA SESSION SUMMARY
Course Elapsed Days: 39
Plan Fractions Treated to Date: 28
Plan Prescribed Dose Per Fraction: 2.5 Gy
Plan Total Fractions Prescribed: 28
Plan Total Prescribed Dose: 70 Gy
Reference Point Dosage Given to Date: 70 Gy
Reference Point Session Dosage Given: 2.5 Gy
Session Number: 28

## 2021-11-27 DIAGNOSIS — R3915 Urgency of urination: Secondary | ICD-10-CM | POA: Diagnosis not present

## 2021-11-27 DIAGNOSIS — C61 Malignant neoplasm of prostate: Secondary | ICD-10-CM | POA: Diagnosis not present

## 2021-11-27 DIAGNOSIS — R3912 Poor urinary stream: Secondary | ICD-10-CM | POA: Diagnosis not present

## 2021-11-27 DIAGNOSIS — C642 Malignant neoplasm of left kidney, except renal pelvis: Secondary | ICD-10-CM | POA: Diagnosis not present

## 2021-12-05 ENCOUNTER — Ambulatory Visit
Admission: RE | Admit: 2021-12-05 | Discharge: 2021-12-05 | Disposition: A | Discharge status: Home / Self Care | Payer: 59 | Source: Ambulatory Visit | Attending: Radiation Oncology | Admitting: Radiation Oncology

## 2021-12-05 DIAGNOSIS — Z510 Encounter for antineoplastic radiation therapy: Secondary | ICD-10-CM | POA: Insufficient documentation

## 2021-12-05 DIAGNOSIS — C61 Malignant neoplasm of prostate: Secondary | ICD-10-CM | POA: Insufficient documentation

## 2021-12-05 NOTE — Progress Notes (Signed)
  Radiation Oncology         [phone]) [phone] ________________________________  Name: John Walters MRN: [id number]  Date of Service: 12/05/2021  DOB: [birth year]  Post Treatment Telephone Note  Diagnosis:  76 y.o. gentleman with Stage T1c adenocarcinoma of the prostate with Gleason score of 4+3, and PSA of 5.97. (as documented in provider EOT note)   Pre Treatment IPSS Score: 1 (as documented in the provider consult note)   The patient was available for call today.   Symptoms of fatigue have improved since completing therapy.  Symptoms of bladder changes have  improved since completing therapy. Current symptoms include none, and medications for bladder symptoms include none.  Symptoms of bowel changes have  improved since completing therapy. Current symptoms include none, and medications for bowel symptoms include none.     Post Treatment IPSS Score: IPSS Questionnaire (AUA-7): Over the past month.   1)  How often have you had a sensation of not emptying your bladder completely after you finish urinating?  0 - Not at all  2)  How often have you had to urinate again less than two hours after you finished urinating? 0 - Not at all  3)  How often have you found you stopped and started again several times when you urinated?  0 - Not at all  4) How difficult have you found it to postpone urination?  0 - Not at all  5) How often have you had a weak urinary stream?  0 - Not at all  6) How often have you had to push or strain to begin urination?  0 - Not at all  7) How many times did you most typically get up to urinate from the time you went to bed until the time you got up in the morning?  3 - 3 times  Total score:  3. Which indicates mild symptoms  0-7 mildly symptomatic   8-19 moderately symptomatic   20-35 severely symptomatic    Patient has a scheduled follow up visit with his urologist, Dr. Louis Meckel, on 01/15/2022 at [facility] for ongoing surveillance. He was counseled that  PSA levels will be drawn in the urology office, and was reassured that additional time is expected to improve bowel and bladder symptoms. He was encouraged to call back with concerns or questions regarding radiation.  This concludes the interview.   Leandra Kern, LPN

## 2022-01-15 DIAGNOSIS — N281 Cyst of kidney, acquired: Secondary | ICD-10-CM | POA: Diagnosis not present

## 2022-01-15 DIAGNOSIS — R3915 Urgency of urination: Secondary | ICD-10-CM | POA: Diagnosis not present

## 2022-01-15 DIAGNOSIS — Z8546 Personal history of malignant neoplasm of prostate: Secondary | ICD-10-CM | POA: Diagnosis not present

## 2022-01-15 DIAGNOSIS — Z125 Encounter for screening for malignant neoplasm of prostate: Secondary | ICD-10-CM | POA: Diagnosis not present

## 2022-01-15 DIAGNOSIS — R3912 Poor urinary stream: Secondary | ICD-10-CM | POA: Diagnosis not present

## 2022-05-11 NOTE — Progress Notes (Signed)
  Radiation Oncology         [phone] ________________________________  Name: John Walters MRN: [id number]  Date: 11/06/2021  DOB: 04/16/1945  End of Treatment Note  Diagnosis:   77 y.o. gentleman with Stage T1c adenocarcinoma of the prostate with Gleason score of 4+3, and PSA of 5.97.      Indication for treatment:  Curative, Definitive Radiotherapy       Radiation treatment dates:   09/28/21 - 11/06/21  Site/dose:   The prostate was treated to 70 Gy in 28 fractions of 2.5 Gy  Beams/energy:   The patient was treated with IMRT using volumetric arc therapy delivering 6 MV X-rays to clockwise and counterclockwise circumferential arcs with a 90 degree collimator offset to avoid dose scalloping.  Image guidance was performed with daily cone beam CT prior to each fraction to align to gold markers in the prostate and assure proper bladder and rectal fill volumes.  Immobilization was achieved with [employer] custom mold.  Narrative: The patient tolerated radiation treatment relatively well.   The patient experienced some minor urinary irritation and modest fatigue.   Plan: The patient has completed radiation treatment. He will return to radiation oncology clinic for routine followup in one month. I advised him to call or return sooner if he has any questions or concerns related to his recovery or treatment. ________________________________  Artist Pais. Kathrynn Running, M.D.

## 2022-06-20 ENCOUNTER — Other Ambulatory Visit (HOSPITAL_COMMUNITY): Payer: Self-pay | Admitting: Internal Medicine

## 2022-06-20 ENCOUNTER — Other Ambulatory Visit (HOSPITAL_COMMUNITY): Payer: Self-pay

## 2022-06-20 DIAGNOSIS — E559 Vitamin D deficiency, unspecified: Secondary | ICD-10-CM | POA: Diagnosis not present

## 2022-06-20 DIAGNOSIS — K625 Hemorrhage of anus and rectum: Secondary | ICD-10-CM | POA: Diagnosis not present

## 2022-06-20 DIAGNOSIS — Z0000 Encounter for general adult medical examination without abnormal findings: Secondary | ICD-10-CM | POA: Diagnosis not present

## 2022-06-20 DIAGNOSIS — C61 Malignant neoplasm of prostate: Secondary | ICD-10-CM | POA: Diagnosis not present

## 2022-06-20 DIAGNOSIS — I10 Essential (primary) hypertension: Secondary | ICD-10-CM | POA: Diagnosis not present

## 2022-06-20 DIAGNOSIS — Z79899 Other long term (current) drug therapy: Secondary | ICD-10-CM | POA: Diagnosis not present

## 2022-06-20 DIAGNOSIS — K219 Gastro-esophageal reflux disease without esophagitis: Secondary | ICD-10-CM | POA: Diagnosis not present

## 2022-06-20 DIAGNOSIS — R030 Elevated blood-pressure reading, without diagnosis of hypertension: Secondary | ICD-10-CM | POA: Diagnosis not present

## 2022-06-20 DIAGNOSIS — E7800 Pure hypercholesterolemia, unspecified: Secondary | ICD-10-CM | POA: Diagnosis not present

## 2022-06-20 DIAGNOSIS — M5432 Sciatica, left side: Secondary | ICD-10-CM | POA: Diagnosis not present

## 2022-06-20 MED ORDER — HYDROCORTISONE ACETATE 25 MG RE SUPP
25.0000 mg | Freq: Two times a day (BID) | RECTAL | 2 refills | Status: AC | PRN
  Filled 2022-06-20: qty 10, 5d supply, fill #0

## 2022-06-28 DIAGNOSIS — H524 Presbyopia: Secondary | ICD-10-CM | POA: Diagnosis not present

## 2022-07-04 ENCOUNTER — Ambulatory Visit (HOSPITAL_COMMUNITY)
Admission: RE | Admit: 2022-07-04 | Discharge: 2022-07-04 | Disposition: A | Discharge status: Home / Self Care | Payer: Commercial Managed Care - PPO | Source: Ambulatory Visit | Attending: Internal Medicine | Admitting: Internal Medicine

## 2022-07-04 DIAGNOSIS — E7800 Pure hypercholesterolemia, unspecified: Secondary | ICD-10-CM | POA: Insufficient documentation

## 2022-07-11 ENCOUNTER — Other Ambulatory Visit (HOSPITAL_COMMUNITY): Payer: Self-pay

## 2022-07-11 MED ORDER — ROSUVASTATIN CALCIUM 5 MG PO TABS
5.0000 mg | ORAL_TABLET | Freq: Every day | ORAL | 1 refills | Status: AC
  Filled 2022-07-11: qty 30, 30d supply, fill #0

## 2022-07-16 DIAGNOSIS — Z8546 Personal history of malignant neoplasm of prostate: Secondary | ICD-10-CM | POA: Diagnosis not present

## 2022-07-17 DIAGNOSIS — H25812 Combined forms of age-related cataract, left eye: Secondary | ICD-10-CM | POA: Diagnosis not present

## 2022-07-17 DIAGNOSIS — H25811 Combined forms of age-related cataract, right eye: Secondary | ICD-10-CM | POA: Diagnosis not present

## 2022-07-19 DIAGNOSIS — Z8546 Personal history of malignant neoplasm of prostate: Secondary | ICD-10-CM | POA: Diagnosis not present

## 2022-07-19 DIAGNOSIS — N5201 Erectile dysfunction due to arterial insufficiency: Secondary | ICD-10-CM | POA: Diagnosis not present

## 2022-07-31 IMAGING — CR DG LUMBAR SPINE 2-3V
2 series · 2 of 2 positions shown · non-contrast
Comparison: None.

CLINICAL DATA: Left-sided back and leg pain, initial encounter

EXAM:
LUMBAR SPINE - 2 VIEW

[t l-spine a.p.]
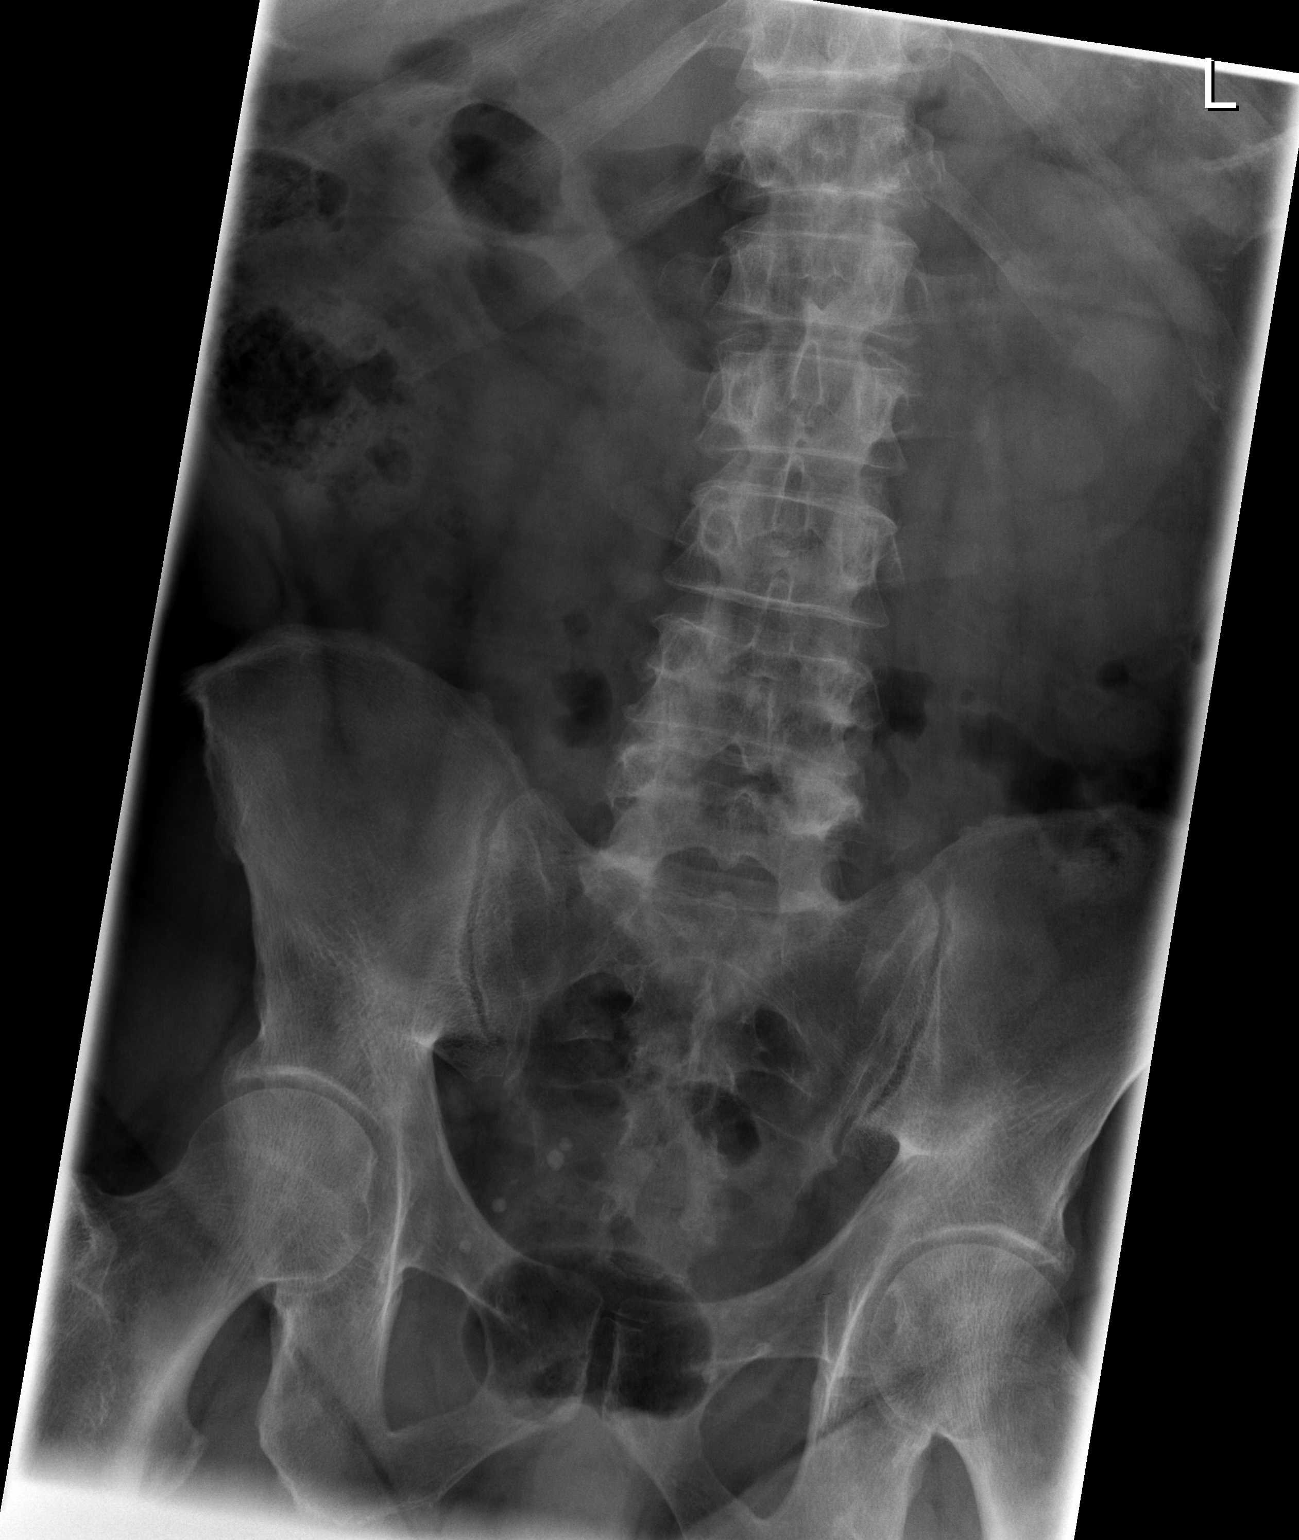

[t l-spine lat]
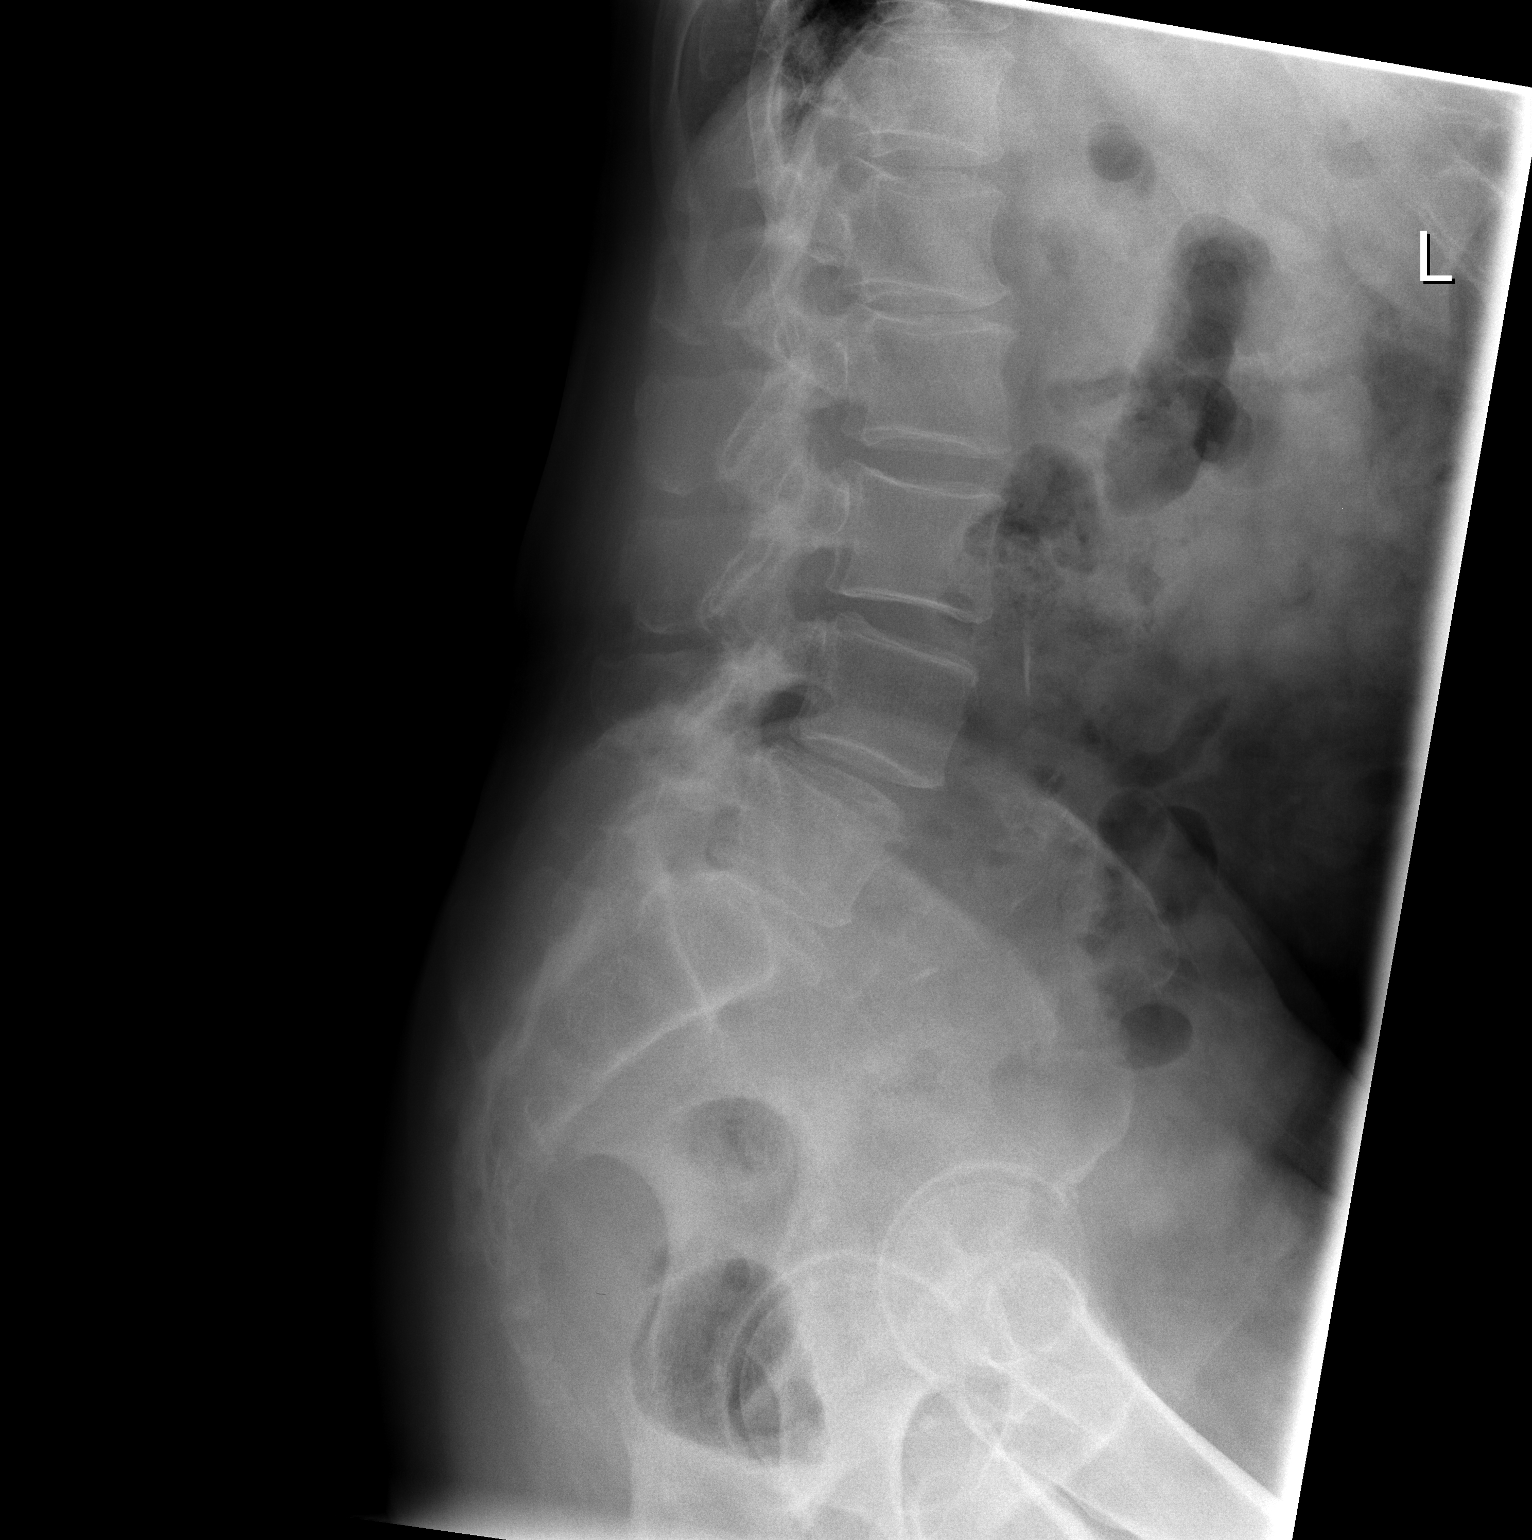

[2 of 2 positions shown; findings below may reference images not displayed]

FINDINGS: Five lumbar type vertebral bodies are well visualized. Vertebral
body height is well maintained. Mild anterolisthesis of L4 on L5 is
noted felt to be of a degenerative nature. No fracture is seen. No
soft tissue abnormality is noted.
IMPRESSION: Degenerative change with mild anterolisthesis.

## 2022-08-01 DIAGNOSIS — Z86010 Personal history of colonic polyps: Secondary | ICD-10-CM | POA: Diagnosis not present

## 2022-08-01 DIAGNOSIS — K219 Gastro-esophageal reflux disease without esophagitis: Secondary | ICD-10-CM | POA: Diagnosis not present

## 2022-08-03 ENCOUNTER — Other Ambulatory Visit (HOSPITAL_COMMUNITY): Payer: Self-pay

## 2022-08-03 MED ORDER — PEG 3350-KCL-NA BICARB-NACL 420 G PO SOLR
ORAL | 0 refills | Status: AC
  Filled 2022-08-03: qty 4000, 1d supply, fill #0

## 2022-08-07 ENCOUNTER — Other Ambulatory Visit (HOSPITAL_COMMUNITY): Payer: Self-pay

## 2022-08-07 MED ORDER — KETOROLAC TROMETHAMINE 0.5 % OP SOLN
OPHTHALMIC | 0 refills | Status: DC
  Filled 2022-08-07: qty 5, 10d supply, fill #0

## 2022-08-07 MED ORDER — OFLOXACIN 0.3 % OP SOLN
OPHTHALMIC | 0 refills | Status: DC
  Filled 2022-08-07: qty 5, 10d supply, fill #0

## 2022-08-07 MED ORDER — PREDNISOLONE ACETATE 1 % OP SUSP
OPHTHALMIC | 0 refills | Status: DC
  Filled 2022-08-07: qty 5, 10d supply, fill #0

## 2022-08-09 DIAGNOSIS — H2513 Age-related nuclear cataract, bilateral: Secondary | ICD-10-CM | POA: Diagnosis not present

## 2022-08-09 DIAGNOSIS — H25811 Combined forms of age-related cataract, right eye: Secondary | ICD-10-CM | POA: Diagnosis not present

## 2022-08-21 ENCOUNTER — Other Ambulatory Visit (HOSPITAL_COMMUNITY): Payer: Self-pay

## 2022-08-21 MED ORDER — PREDNISOLONE ACETATE 1 % OP SUSP
OPHTHALMIC | 0 refills | Status: AC
  Filled 2022-08-21: qty 5, 30d supply, fill #0

## 2022-08-21 MED ORDER — OFLOXACIN 0.3 % OP SOLN
OPHTHALMIC | 0 refills | Status: AC
  Filled 2022-08-21: qty 5, 10d supply, fill #0

## 2022-08-21 MED ORDER — KETOROLAC TROMETHAMINE 0.5 % OP SOLN
OPHTHALMIC | 0 refills | Status: AC
  Filled 2022-08-21: qty 5, 30d supply, fill #0

## 2022-08-23 DIAGNOSIS — H25812 Combined forms of age-related cataract, left eye: Secondary | ICD-10-CM | POA: Diagnosis not present

## 2022-08-23 DIAGNOSIS — H2513 Age-related nuclear cataract, bilateral: Secondary | ICD-10-CM | POA: Diagnosis not present

## 2022-09-13 DIAGNOSIS — Z79899 Other long term (current) drug therapy: Secondary | ICD-10-CM | POA: Diagnosis not present

## 2022-09-14 DIAGNOSIS — D122 Benign neoplasm of ascending colon: Secondary | ICD-10-CM | POA: Diagnosis not present

## 2022-09-14 DIAGNOSIS — Z09 Encounter for follow-up examination after completed treatment for conditions other than malignant neoplasm: Secondary | ICD-10-CM | POA: Diagnosis not present

## 2022-09-14 DIAGNOSIS — D123 Benign neoplasm of transverse colon: Secondary | ICD-10-CM | POA: Diagnosis not present

## 2022-09-14 DIAGNOSIS — Z86010 Personal history of colonic polyps: Secondary | ICD-10-CM | POA: Diagnosis not present

## 2022-09-14 DIAGNOSIS — K5730 Diverticulosis of large intestine without perforation or abscess without bleeding: Secondary | ICD-10-CM | POA: Diagnosis not present

## 2022-09-25 DIAGNOSIS — R931 Abnormal findings on diagnostic imaging of heart and coronary circulation: Secondary | ICD-10-CM | POA: Diagnosis not present

## 2022-09-25 DIAGNOSIS — L989 Disorder of the skin and subcutaneous tissue, unspecified: Secondary | ICD-10-CM | POA: Diagnosis not present

## 2022-09-25 DIAGNOSIS — R030 Elevated blood-pressure reading, without diagnosis of hypertension: Secondary | ICD-10-CM | POA: Diagnosis not present

## 2022-09-25 DIAGNOSIS — D126 Benign neoplasm of colon, unspecified: Secondary | ICD-10-CM | POA: Diagnosis not present

## 2022-09-25 DIAGNOSIS — Z23 Encounter for immunization: Secondary | ICD-10-CM | POA: Diagnosis not present

## 2022-09-25 DIAGNOSIS — E7800 Pure hypercholesterolemia, unspecified: Secondary | ICD-10-CM | POA: Diagnosis not present

## 2022-11-05 DIAGNOSIS — L820 Inflamed seborrheic keratosis: Secondary | ICD-10-CM | POA: Diagnosis not present

## 2022-11-05 DIAGNOSIS — L2989 Other pruritus: Secondary | ICD-10-CM | POA: Diagnosis not present

## 2022-11-05 DIAGNOSIS — L538 Other specified erythematous conditions: Secondary | ICD-10-CM | POA: Diagnosis not present

## 2022-11-05 DIAGNOSIS — L821 Other seborrheic keratosis: Secondary | ICD-10-CM | POA: Diagnosis not present

## 2022-12-10 ENCOUNTER — Other Ambulatory Visit (HOSPITAL_COMMUNITY): Payer: Self-pay

## 2022-12-10 MED ORDER — SULINDAC 150 MG PO TABS
150.0000 mg | ORAL_TABLET | Freq: Every day | ORAL | 1 refills | Status: AC | PRN
  Filled 2022-12-10: qty 90, 90d supply, fill #0

## 2022-12-11 ENCOUNTER — Other Ambulatory Visit (HOSPITAL_COMMUNITY): Payer: Self-pay

## 2023-01-01 ENCOUNTER — Other Ambulatory Visit (HOSPITAL_COMMUNITY): Payer: Self-pay

## 2023-01-15 DIAGNOSIS — C61 Malignant neoplasm of prostate: Secondary | ICD-10-CM | POA: Diagnosis not present

## 2023-01-15 DIAGNOSIS — Z8546 Personal history of malignant neoplasm of prostate: Secondary | ICD-10-CM | POA: Diagnosis not present

## 2023-01-22 ENCOUNTER — Other Ambulatory Visit (HOSPITAL_COMMUNITY): Payer: Self-pay

## 2023-01-22 DIAGNOSIS — N5201 Erectile dysfunction due to arterial insufficiency: Secondary | ICD-10-CM | POA: Diagnosis not present

## 2023-01-22 DIAGNOSIS — Z8546 Personal history of malignant neoplasm of prostate: Secondary | ICD-10-CM | POA: Diagnosis not present

## 2023-01-22 MED ORDER — SILDENAFIL CITRATE 100 MG PO TABS
50.0000 mg | ORAL_TABLET | Freq: Every day | ORAL | 3 refills | Status: AC | PRN
  Filled 2023-01-22: qty 10, 20d supply, fill #0

## 2023-06-21 ENCOUNTER — Other Ambulatory Visit (HOSPITAL_COMMUNITY): Payer: Self-pay

## 2023-06-21 DIAGNOSIS — N1831 Chronic kidney disease, stage 3a: Secondary | ICD-10-CM | POA: Diagnosis not present

## 2023-06-21 DIAGNOSIS — E7800 Pure hypercholesterolemia, unspecified: Secondary | ICD-10-CM | POA: Diagnosis not present

## 2023-06-21 DIAGNOSIS — R931 Abnormal findings on diagnostic imaging of heart and coronary circulation: Secondary | ICD-10-CM | POA: Diagnosis not present

## 2023-06-21 DIAGNOSIS — Z0000 Encounter for general adult medical examination without abnormal findings: Secondary | ICD-10-CM | POA: Diagnosis not present

## 2023-06-21 DIAGNOSIS — R030 Elevated blood-pressure reading, without diagnosis of hypertension: Secondary | ICD-10-CM | POA: Diagnosis not present

## 2023-06-21 DIAGNOSIS — R252 Cramp and spasm: Secondary | ICD-10-CM | POA: Diagnosis not present

## 2023-06-21 DIAGNOSIS — M5432 Sciatica, left side: Secondary | ICD-10-CM | POA: Diagnosis not present

## 2023-06-21 DIAGNOSIS — I10 Essential (primary) hypertension: Secondary | ICD-10-CM | POA: Diagnosis not present

## 2023-06-21 DIAGNOSIS — E559 Vitamin D deficiency, unspecified: Secondary | ICD-10-CM | POA: Diagnosis not present

## 2023-06-21 DIAGNOSIS — K219 Gastro-esophageal reflux disease without esophagitis: Secondary | ICD-10-CM | POA: Diagnosis not present

## 2023-06-21 DIAGNOSIS — Z79899 Other long term (current) drug therapy: Secondary | ICD-10-CM | POA: Diagnosis not present

## 2023-06-21 MED ORDER — SULINDAC 150 MG PO TABS
150.0000 mg | ORAL_TABLET | Freq: Every day | ORAL | 1 refills | Status: AC | PRN
  Filled 2023-06-21: qty 90, 90d supply, fill #0

## 2023-06-21 MED ORDER — PANTOPRAZOLE SODIUM 40 MG PO TBEC
40.0000 mg | DELAYED_RELEASE_TABLET | ORAL | 3 refills | Status: AC | PRN
  Filled 2023-06-21: qty 45, 90d supply, fill #0

## 2023-06-21 MED ORDER — CYCLOBENZAPRINE HCL 10 MG PO TABS
10.0000 mg | ORAL_TABLET | Freq: Every evening | ORAL | 0 refills | Status: AC | PRN
  Filled 2023-06-21: qty 90, 90d supply, fill #0

## 2023-06-24 ENCOUNTER — Other Ambulatory Visit (HOSPITAL_COMMUNITY): Payer: Self-pay

## 2023-06-24 ENCOUNTER — Other Ambulatory Visit: Payer: Self-pay

## 2023-08-08 DIAGNOSIS — Z8546 Personal history of malignant neoplasm of prostate: Secondary | ICD-10-CM | POA: Diagnosis not present

## 2023-08-15 DIAGNOSIS — Z8546 Personal history of malignant neoplasm of prostate: Secondary | ICD-10-CM | POA: Diagnosis not present

## 2023-08-15 DIAGNOSIS — N5201 Erectile dysfunction due to arterial insufficiency: Secondary | ICD-10-CM | POA: Diagnosis not present

## 2023-08-15 DIAGNOSIS — R3912 Poor urinary stream: Secondary | ICD-10-CM | POA: Diagnosis not present

## 2023-10-16 DIAGNOSIS — Z23 Encounter for immunization: Secondary | ICD-10-CM | POA: Diagnosis not present
